# Patient Record
Sex: Female | Born: 1955
Health system: Southern US, Community
[De-identification: ages and names within clinical notes are randomized; demographics above are authoritative.]

## PROBLEM LIST (undated history)

## (undated) DIAGNOSIS — E785 Hyperlipidemia, unspecified: Secondary | ICD-10-CM

## (undated) DIAGNOSIS — E079 Disorder of thyroid, unspecified: Secondary | ICD-10-CM

## (undated) HISTORY — PX: NO PAST SURGERIES: SHX2092

---

## 2013-12-07 ENCOUNTER — Other Ambulatory Visit (HOSPITAL_COMMUNITY)
Admission: RE | Admit: 2013-12-07 | Discharge: 2013-12-07 | Disposition: A | Discharge status: Home / Self Care | Payer: BC Managed Care – PPO | Source: Ambulatory Visit | Attending: Obstetrics & Gynecology | Admitting: Obstetrics & Gynecology

## 2013-12-07 DIAGNOSIS — Z1151 Encounter for screening for human papillomavirus (HPV): Secondary | ICD-10-CM | POA: Insufficient documentation

## 2013-12-07 DIAGNOSIS — Z01419 Encounter for gynecological examination (general) (routine) without abnormal findings: Secondary | ICD-10-CM | POA: Insufficient documentation

## 2013-12-09 ENCOUNTER — Other Ambulatory Visit: Payer: Self-pay

## 2013-12-09 DIAGNOSIS — Z1231 Encounter for screening mammogram for malignant neoplasm of breast: Secondary | ICD-10-CM

## 2013-12-28 ENCOUNTER — Ambulatory Visit
Admission: RE | Admit: 2013-12-28 | Discharge: 2013-12-28 | Disposition: A | Discharge status: Home / Self Care | Payer: BC Managed Care – PPO | Source: Ambulatory Visit

## 2013-12-28 DIAGNOSIS — Z1231 Encounter for screening mammogram for malignant neoplasm of breast: Secondary | ICD-10-CM

## 2014-12-02 DIAGNOSIS — J0100 Acute maxillary sinusitis, unspecified: Secondary | ICD-10-CM | POA: Diagnosis not present

## 2015-03-01 DIAGNOSIS — E039 Hypothyroidism, unspecified: Secondary | ICD-10-CM | POA: Diagnosis not present

## 2015-03-01 DIAGNOSIS — G43909 Migraine, unspecified, not intractable, without status migrainosus: Secondary | ICD-10-CM | POA: Diagnosis not present

## 2015-03-09 DIAGNOSIS — R51 Headache: Secondary | ICD-10-CM | POA: Diagnosis not present

## 2015-03-29 DIAGNOSIS — E785 Hyperlipidemia, unspecified: Secondary | ICD-10-CM | POA: Diagnosis not present

## 2015-03-29 DIAGNOSIS — E039 Hypothyroidism, unspecified: Secondary | ICD-10-CM | POA: Diagnosis not present

## 2015-05-04 DIAGNOSIS — G43009 Migraine without aura, not intractable, without status migrainosus: Secondary | ICD-10-CM | POA: Diagnosis not present

## 2015-05-04 DIAGNOSIS — H532 Diplopia: Secondary | ICD-10-CM | POA: Diagnosis not present

## 2015-05-04 DIAGNOSIS — R51 Headache: Secondary | ICD-10-CM | POA: Diagnosis not present

## 2015-05-04 DIAGNOSIS — G44201 Tension-type headache, unspecified, intractable: Secondary | ICD-10-CM | POA: Diagnosis not present

## 2015-06-26 ENCOUNTER — Other Ambulatory Visit: Payer: Self-pay

## 2015-06-26 DIAGNOSIS — Z1231 Encounter for screening mammogram for malignant neoplasm of breast: Secondary | ICD-10-CM

## 2015-07-07 DIAGNOSIS — L299 Pruritus, unspecified: Secondary | ICD-10-CM | POA: Diagnosis not present

## 2015-07-07 DIAGNOSIS — L309 Dermatitis, unspecified: Secondary | ICD-10-CM | POA: Diagnosis not present

## 2015-07-07 DIAGNOSIS — R6884 Jaw pain: Secondary | ICD-10-CM | POA: Diagnosis not present

## 2015-07-18 DIAGNOSIS — K921 Melena: Secondary | ICD-10-CM | POA: Diagnosis not present

## 2015-07-18 DIAGNOSIS — Z86010 Personal history of colonic polyps: Secondary | ICD-10-CM | POA: Diagnosis not present

## 2015-07-24 DIAGNOSIS — Z01419 Encounter for gynecological examination (general) (routine) without abnormal findings: Secondary | ICD-10-CM | POA: Diagnosis not present

## 2015-07-27 DIAGNOSIS — Z8371 Family history of colonic polyps: Secondary | ICD-10-CM | POA: Diagnosis not present

## 2015-07-27 DIAGNOSIS — K922 Gastrointestinal hemorrhage, unspecified: Secondary | ICD-10-CM | POA: Diagnosis not present

## 2015-07-27 DIAGNOSIS — K5730 Diverticulosis of large intestine without perforation or abscess without bleeding: Secondary | ICD-10-CM | POA: Diagnosis not present

## 2015-07-27 DIAGNOSIS — K648 Other hemorrhoids: Secondary | ICD-10-CM | POA: Diagnosis not present

## 2015-07-27 DIAGNOSIS — Z86010 Personal history of colonic polyps: Secondary | ICD-10-CM | POA: Diagnosis not present

## 2015-08-01 ENCOUNTER — Ambulatory Visit
Admission: RE | Admit: 2015-08-01 | Discharge: 2015-08-01 | Disposition: A | Discharge status: Home / Self Care | Payer: Medicare Other | Source: Ambulatory Visit

## 2015-08-01 DIAGNOSIS — Z1231 Encounter for screening mammogram for malignant neoplasm of breast: Secondary | ICD-10-CM | POA: Diagnosis not present

## 2015-08-09 DIAGNOSIS — L219 Seborrheic dermatitis, unspecified: Secondary | ICD-10-CM | POA: Diagnosis not present

## 2015-08-09 DIAGNOSIS — L820 Inflamed seborrheic keratosis: Secondary | ICD-10-CM | POA: Diagnosis not present

## 2015-08-17 DIAGNOSIS — J029 Acute pharyngitis, unspecified: Secondary | ICD-10-CM | POA: Diagnosis not present

## 2015-08-29 DIAGNOSIS — R05 Cough: Secondary | ICD-10-CM | POA: Diagnosis not present

## 2015-09-13 DIAGNOSIS — L820 Inflamed seborrheic keratosis: Secondary | ICD-10-CM | POA: Diagnosis not present

## 2015-10-05 DIAGNOSIS — E785 Hyperlipidemia, unspecified: Secondary | ICD-10-CM | POA: Diagnosis not present

## 2015-12-28 DIAGNOSIS — R51 Headache: Secondary | ICD-10-CM | POA: Diagnosis not present

## 2015-12-28 DIAGNOSIS — H9190 Unspecified hearing loss, unspecified ear: Secondary | ICD-10-CM | POA: Diagnosis not present

## 2016-03-11 DIAGNOSIS — L84 Corns and callosities: Secondary | ICD-10-CM | POA: Diagnosis not present

## 2016-03-11 DIAGNOSIS — I781 Nevus, non-neoplastic: Secondary | ICD-10-CM | POA: Diagnosis not present

## 2016-04-08 DIAGNOSIS — G43909 Migraine, unspecified, not intractable, without status migrainosus: Secondary | ICD-10-CM | POA: Diagnosis not present

## 2016-04-08 DIAGNOSIS — H9190 Unspecified hearing loss, unspecified ear: Secondary | ICD-10-CM | POA: Diagnosis not present

## 2016-04-08 DIAGNOSIS — E039 Hypothyroidism, unspecified: Secondary | ICD-10-CM | POA: Diagnosis not present

## 2016-04-08 DIAGNOSIS — E7800 Pure hypercholesterolemia, unspecified: Secondary | ICD-10-CM | POA: Diagnosis not present

## 2016-07-04 ENCOUNTER — Other Ambulatory Visit: Payer: Self-pay | Admitting: Family Medicine

## 2016-07-04 DIAGNOSIS — Z1231 Encounter for screening mammogram for malignant neoplasm of breast: Secondary | ICD-10-CM

## 2016-07-15 DIAGNOSIS — E7800 Pure hypercholesterolemia, unspecified: Secondary | ICD-10-CM | POA: Diagnosis not present

## 2016-08-08 ENCOUNTER — Ambulatory Visit
Admission: RE | Admit: 2016-08-08 | Discharge: 2016-08-08 | Disposition: A | Discharge status: Home / Self Care | Payer: Medicare Other | Source: Ambulatory Visit | Attending: Family Medicine | Admitting: Family Medicine

## 2016-08-08 DIAGNOSIS — Z1231 Encounter for screening mammogram for malignant neoplasm of breast: Secondary | ICD-10-CM

## 2016-08-12 ENCOUNTER — Other Ambulatory Visit: Payer: Self-pay | Admitting: Family Medicine

## 2016-08-12 DIAGNOSIS — R928 Other abnormal and inconclusive findings on diagnostic imaging of breast: Secondary | ICD-10-CM

## 2016-08-20 ENCOUNTER — Ambulatory Visit
Admission: RE | Admit: 2016-08-20 | Discharge: 2016-08-20 | Disposition: A | Discharge status: Home / Self Care | Payer: Medicare Other | Source: Ambulatory Visit | Attending: Family Medicine | Admitting: Family Medicine

## 2016-08-20 DIAGNOSIS — R928 Other abnormal and inconclusive findings on diagnostic imaging of breast: Secondary | ICD-10-CM | POA: Diagnosis not present

## 2016-09-05 DIAGNOSIS — G43009 Migraine without aura, not intractable, without status migrainosus: Secondary | ICD-10-CM | POA: Diagnosis not present

## 2016-09-05 DIAGNOSIS — F439 Reaction to severe stress, unspecified: Secondary | ICD-10-CM | POA: Diagnosis not present

## 2016-09-24 DIAGNOSIS — E7800 Pure hypercholesterolemia, unspecified: Secondary | ICD-10-CM | POA: Diagnosis not present

## 2016-09-25 DIAGNOSIS — J029 Acute pharyngitis, unspecified: Secondary | ICD-10-CM | POA: Diagnosis not present

## 2016-09-30 DIAGNOSIS — N952 Postmenopausal atrophic vaginitis: Secondary | ICD-10-CM | POA: Diagnosis not present

## 2016-09-30 DIAGNOSIS — L298 Other pruritus: Secondary | ICD-10-CM | POA: Diagnosis not present

## 2016-09-30 DIAGNOSIS — R102 Pelvic and perineal pain: Secondary | ICD-10-CM | POA: Diagnosis not present

## 2016-10-07 DIAGNOSIS — R102 Pelvic and perineal pain: Secondary | ICD-10-CM | POA: Diagnosis not present

## 2016-10-21 DIAGNOSIS — R109 Unspecified abdominal pain: Secondary | ICD-10-CM | POA: Diagnosis not present

## 2016-10-22 DIAGNOSIS — Z86010 Personal history of colonic polyps: Secondary | ICD-10-CM | POA: Diagnosis not present

## 2016-10-22 DIAGNOSIS — K5900 Constipation, unspecified: Secondary | ICD-10-CM | POA: Diagnosis not present

## 2016-10-22 DIAGNOSIS — R109 Unspecified abdominal pain: Secondary | ICD-10-CM | POA: Diagnosis not present

## 2017-01-21 DIAGNOSIS — K921 Melena: Secondary | ICD-10-CM | POA: Diagnosis not present

## 2017-01-21 DIAGNOSIS — K5900 Constipation, unspecified: Secondary | ICD-10-CM | POA: Diagnosis not present

## 2017-03-17 DIAGNOSIS — J029 Acute pharyngitis, unspecified: Secondary | ICD-10-CM | POA: Diagnosis not present

## 2017-04-14 DIAGNOSIS — Z1159 Encounter for screening for other viral diseases: Secondary | ICD-10-CM | POA: Diagnosis not present

## 2017-04-14 DIAGNOSIS — E7800 Pure hypercholesterolemia, unspecified: Secondary | ICD-10-CM | POA: Diagnosis not present

## 2017-04-14 DIAGNOSIS — Z6824 Body mass index (BMI) 24.0-24.9, adult: Secondary | ICD-10-CM | POA: Diagnosis not present

## 2017-04-14 DIAGNOSIS — H9190 Unspecified hearing loss, unspecified ear: Secondary | ICD-10-CM | POA: Diagnosis not present

## 2017-04-14 DIAGNOSIS — B002 Herpesviral gingivostomatitis and pharyngotonsillitis: Secondary | ICD-10-CM | POA: Diagnosis not present

## 2017-04-14 DIAGNOSIS — E039 Hypothyroidism, unspecified: Secondary | ICD-10-CM | POA: Diagnosis not present

## 2017-04-14 DIAGNOSIS — Z0000 Encounter for general adult medical examination without abnormal findings: Secondary | ICD-10-CM | POA: Diagnosis not present

## 2017-04-14 DIAGNOSIS — G43909 Migraine, unspecified, not intractable, without status migrainosus: Secondary | ICD-10-CM | POA: Diagnosis not present

## 2017-04-22 DIAGNOSIS — B029 Zoster without complications: Secondary | ICD-10-CM | POA: Diagnosis not present

## 2017-05-07 DIAGNOSIS — E860 Dehydration: Secondary | ICD-10-CM | POA: Diagnosis not present

## 2017-05-19 DIAGNOSIS — B078 Other viral warts: Secondary | ICD-10-CM | POA: Diagnosis not present

## 2017-05-19 DIAGNOSIS — L821 Other seborrheic keratosis: Secondary | ICD-10-CM | POA: Diagnosis not present

## 2017-05-19 DIAGNOSIS — Z1283 Encounter for screening for malignant neoplasm of skin: Secondary | ICD-10-CM | POA: Diagnosis not present

## 2017-06-24 DIAGNOSIS — E860 Dehydration: Secondary | ICD-10-CM | POA: Diagnosis not present

## 2017-07-23 DIAGNOSIS — Z01419 Encounter for gynecological examination (general) (routine) without abnormal findings: Secondary | ICD-10-CM | POA: Diagnosis not present

## 2017-07-28 ENCOUNTER — Other Ambulatory Visit: Payer: Self-pay | Admitting: Family Medicine

## 2017-07-28 DIAGNOSIS — Z1231 Encounter for screening mammogram for malignant neoplasm of breast: Secondary | ICD-10-CM

## 2017-08-28 ENCOUNTER — Ambulatory Visit
Admission: RE | Admit: 2017-08-28 | Discharge: 2017-08-28 | Disposition: A | Discharge status: Home / Self Care | Payer: Medicare Other | Source: Ambulatory Visit | Attending: Family Medicine | Admitting: Family Medicine

## 2017-08-28 DIAGNOSIS — Z1231 Encounter for screening mammogram for malignant neoplasm of breast: Secondary | ICD-10-CM | POA: Diagnosis not present

## 2017-09-08 DIAGNOSIS — L84 Corns and callosities: Secondary | ICD-10-CM | POA: Diagnosis not present

## 2017-09-26 DIAGNOSIS — J0110 Acute frontal sinusitis, unspecified: Secondary | ICD-10-CM | POA: Diagnosis not present

## 2017-10-21 DIAGNOSIS — R05 Cough: Secondary | ICD-10-CM | POA: Diagnosis not present

## 2017-11-15 DIAGNOSIS — J309 Allergic rhinitis, unspecified: Secondary | ICD-10-CM | POA: Diagnosis not present

## 2017-12-25 DIAGNOSIS — B001 Herpesviral vesicular dermatitis: Secondary | ICD-10-CM | POA: Diagnosis not present

## 2018-01-29 DIAGNOSIS — S46912A Strain of unspecified muscle, fascia and tendon at shoulder and upper arm level, left arm, initial encounter: Secondary | ICD-10-CM | POA: Diagnosis not present

## 2018-02-19 DIAGNOSIS — S46912D Strain of unspecified muscle, fascia and tendon at shoulder and upper arm level, left arm, subsequent encounter: Secondary | ICD-10-CM | POA: Diagnosis not present

## 2018-03-09 DIAGNOSIS — M778 Other enthesopathies, not elsewhere classified: Secondary | ICD-10-CM | POA: Diagnosis not present

## 2018-03-10 DIAGNOSIS — M7712 Lateral epicondylitis, left elbow: Secondary | ICD-10-CM | POA: Diagnosis not present

## 2018-03-10 DIAGNOSIS — M25522 Pain in left elbow: Secondary | ICD-10-CM | POA: Diagnosis not present

## 2018-03-10 DIAGNOSIS — M7710 Lateral epicondylitis, unspecified elbow: Secondary | ICD-10-CM | POA: Insufficient documentation

## 2018-03-24 DIAGNOSIS — M7712 Lateral epicondylitis, left elbow: Secondary | ICD-10-CM | POA: Diagnosis not present

## 2018-04-22 DIAGNOSIS — E7800 Pure hypercholesterolemia, unspecified: Secondary | ICD-10-CM | POA: Diagnosis not present

## 2018-04-22 DIAGNOSIS — M7712 Lateral epicondylitis, left elbow: Secondary | ICD-10-CM | POA: Diagnosis not present

## 2018-04-22 DIAGNOSIS — H9190 Unspecified hearing loss, unspecified ear: Secondary | ICD-10-CM | POA: Diagnosis not present

## 2018-04-22 DIAGNOSIS — Z1389 Encounter for screening for other disorder: Secondary | ICD-10-CM | POA: Diagnosis not present

## 2018-04-22 DIAGNOSIS — Z0000 Encounter for general adult medical examination without abnormal findings: Secondary | ICD-10-CM | POA: Diagnosis not present

## 2018-04-22 DIAGNOSIS — E039 Hypothyroidism, unspecified: Secondary | ICD-10-CM | POA: Diagnosis not present

## 2018-04-22 DIAGNOSIS — B002 Herpesviral gingivostomatitis and pharyngotonsillitis: Secondary | ICD-10-CM | POA: Diagnosis not present

## 2018-04-22 DIAGNOSIS — Z136 Encounter for screening for cardiovascular disorders: Secondary | ICD-10-CM | POA: Diagnosis not present

## 2018-04-22 DIAGNOSIS — J309 Allergic rhinitis, unspecified: Secondary | ICD-10-CM | POA: Diagnosis not present

## 2018-04-22 DIAGNOSIS — G43909 Migraine, unspecified, not intractable, without status migrainosus: Secondary | ICD-10-CM | POA: Diagnosis not present

## 2018-05-19 DIAGNOSIS — H019 Unspecified inflammation of eyelid: Secondary | ICD-10-CM | POA: Diagnosis not present

## 2018-06-10 DIAGNOSIS — L84 Corns and callosities: Secondary | ICD-10-CM | POA: Diagnosis not present

## 2018-07-13 DIAGNOSIS — L820 Inflamed seborrheic keratosis: Secondary | ICD-10-CM | POA: Diagnosis not present

## 2018-08-06 ENCOUNTER — Other Ambulatory Visit: Payer: Self-pay | Admitting: Family Medicine

## 2018-08-06 DIAGNOSIS — Z1231 Encounter for screening mammogram for malignant neoplasm of breast: Secondary | ICD-10-CM

## 2018-08-13 DIAGNOSIS — M533 Sacrococcygeal disorders, not elsewhere classified: Secondary | ICD-10-CM | POA: Diagnosis not present

## 2018-08-13 DIAGNOSIS — W19XXXA Unspecified fall, initial encounter: Secondary | ICD-10-CM | POA: Diagnosis not present

## 2018-09-11 ENCOUNTER — Ambulatory Visit
Admission: RE | Admit: 2018-09-11 | Discharge: 2018-09-11 | Disposition: A | Discharge status: Home / Self Care | Payer: Medicare Other | Source: Ambulatory Visit | Attending: Family Medicine | Admitting: Family Medicine

## 2018-09-11 DIAGNOSIS — Z1231 Encounter for screening mammogram for malignant neoplasm of breast: Secondary | ICD-10-CM | POA: Diagnosis not present

## 2018-12-03 DIAGNOSIS — R51 Headache: Secondary | ICD-10-CM | POA: Diagnosis not present

## 2018-12-03 DIAGNOSIS — Z9109 Other allergy status, other than to drugs and biological substances: Secondary | ICD-10-CM | POA: Diagnosis not present

## 2019-05-18 DIAGNOSIS — H9190 Unspecified hearing loss, unspecified ear: Secondary | ICD-10-CM | POA: Diagnosis not present

## 2019-05-18 DIAGNOSIS — R350 Frequency of micturition: Secondary | ICD-10-CM | POA: Diagnosis not present

## 2019-05-18 DIAGNOSIS — B002 Herpesviral gingivostomatitis and pharyngotonsillitis: Secondary | ICD-10-CM | POA: Diagnosis not present

## 2019-05-18 DIAGNOSIS — J309 Allergic rhinitis, unspecified: Secondary | ICD-10-CM | POA: Diagnosis not present

## 2019-05-18 DIAGNOSIS — Z1389 Encounter for screening for other disorder: Secondary | ICD-10-CM | POA: Diagnosis not present

## 2019-05-18 DIAGNOSIS — E7800 Pure hypercholesterolemia, unspecified: Secondary | ICD-10-CM | POA: Diagnosis not present

## 2019-05-18 DIAGNOSIS — N3281 Overactive bladder: Secondary | ICD-10-CM | POA: Diagnosis not present

## 2019-05-18 DIAGNOSIS — G43909 Migraine, unspecified, not intractable, without status migrainosus: Secondary | ICD-10-CM | POA: Diagnosis not present

## 2019-05-18 DIAGNOSIS — Z0000 Encounter for general adult medical examination without abnormal findings: Secondary | ICD-10-CM | POA: Diagnosis not present

## 2019-05-18 DIAGNOSIS — E039 Hypothyroidism, unspecified: Secondary | ICD-10-CM | POA: Diagnosis not present

## 2019-05-18 DIAGNOSIS — M7712 Lateral epicondylitis, left elbow: Secondary | ICD-10-CM | POA: Diagnosis not present

## 2019-05-18 DIAGNOSIS — H60543 Acute eczematoid otitis externa, bilateral: Secondary | ICD-10-CM | POA: Diagnosis not present

## 2019-06-23 DIAGNOSIS — Z86010 Personal history of colonic polyps: Secondary | ICD-10-CM | POA: Diagnosis not present

## 2019-06-23 DIAGNOSIS — K649 Unspecified hemorrhoids: Secondary | ICD-10-CM | POA: Diagnosis not present

## 2019-07-27 DIAGNOSIS — Z01419 Encounter for gynecological examination (general) (routine) without abnormal findings: Secondary | ICD-10-CM | POA: Diagnosis not present

## 2019-08-10 ENCOUNTER — Other Ambulatory Visit: Payer: Self-pay | Admitting: Family Medicine

## 2019-08-10 DIAGNOSIS — Z1231 Encounter for screening mammogram for malignant neoplasm of breast: Secondary | ICD-10-CM

## 2019-09-14 DIAGNOSIS — N3001 Acute cystitis with hematuria: Secondary | ICD-10-CM | POA: Diagnosis not present

## 2019-09-14 DIAGNOSIS — R300 Dysuria: Secondary | ICD-10-CM | POA: Diagnosis not present

## 2019-09-24 DIAGNOSIS — R3989 Other symptoms and signs involving the genitourinary system: Secondary | ICD-10-CM | POA: Diagnosis not present

## 2019-09-27 ENCOUNTER — Other Ambulatory Visit: Payer: Self-pay

## 2019-09-27 ENCOUNTER — Ambulatory Visit
Admission: RE | Admit: 2019-09-27 | Discharge: 2019-09-27 | Disposition: A | Discharge status: Home / Self Care | Payer: Medicare Other | Source: Ambulatory Visit | Attending: Family Medicine | Admitting: Family Medicine

## 2019-09-27 DIAGNOSIS — Z1231 Encounter for screening mammogram for malignant neoplasm of breast: Secondary | ICD-10-CM | POA: Diagnosis not present

## 2019-10-08 DIAGNOSIS — R300 Dysuria: Secondary | ICD-10-CM | POA: Diagnosis not present

## 2019-10-08 DIAGNOSIS — N3281 Overactive bladder: Secondary | ICD-10-CM | POA: Diagnosis not present

## 2019-10-27 DIAGNOSIS — Z1283 Encounter for screening for malignant neoplasm of skin: Secondary | ICD-10-CM | POA: Diagnosis not present

## 2019-10-27 DIAGNOSIS — D225 Melanocytic nevi of trunk: Secondary | ICD-10-CM | POA: Diagnosis not present

## 2019-10-27 DIAGNOSIS — L821 Other seborrheic keratosis: Secondary | ICD-10-CM | POA: Diagnosis not present

## 2019-11-23 DIAGNOSIS — N3281 Overactive bladder: Secondary | ICD-10-CM | POA: Diagnosis not present

## 2019-12-20 DIAGNOSIS — N3281 Overactive bladder: Secondary | ICD-10-CM | POA: Diagnosis not present

## 2020-01-19 DIAGNOSIS — N3281 Overactive bladder: Secondary | ICD-10-CM | POA: Diagnosis not present

## 2020-02-15 DIAGNOSIS — L820 Inflamed seborrheic keratosis: Secondary | ICD-10-CM | POA: Diagnosis not present

## 2020-02-15 DIAGNOSIS — L728 Other follicular cysts of the skin and subcutaneous tissue: Secondary | ICD-10-CM | POA: Diagnosis not present

## 2020-02-29 DIAGNOSIS — N3001 Acute cystitis with hematuria: Secondary | ICD-10-CM | POA: Diagnosis not present

## 2020-02-29 DIAGNOSIS — R300 Dysuria: Secondary | ICD-10-CM | POA: Diagnosis not present

## 2020-04-05 DIAGNOSIS — R399 Unspecified symptoms and signs involving the genitourinary system: Secondary | ICD-10-CM | POA: Diagnosis not present

## 2020-04-05 DIAGNOSIS — N3000 Acute cystitis without hematuria: Secondary | ICD-10-CM | POA: Diagnosis not present

## 2020-04-26 DIAGNOSIS — K649 Unspecified hemorrhoids: Secondary | ICD-10-CM | POA: Diagnosis not present

## 2020-04-26 DIAGNOSIS — Z86010 Personal history of colonic polyps: Secondary | ICD-10-CM | POA: Diagnosis not present

## 2020-05-24 DIAGNOSIS — E039 Hypothyroidism, unspecified: Secondary | ICD-10-CM | POA: Diagnosis not present

## 2020-05-24 DIAGNOSIS — Z0000 Encounter for general adult medical examination without abnormal findings: Secondary | ICD-10-CM | POA: Diagnosis not present

## 2020-05-24 DIAGNOSIS — N3281 Overactive bladder: Secondary | ICD-10-CM | POA: Diagnosis not present

## 2020-05-24 DIAGNOSIS — B002 Herpesviral gingivostomatitis and pharyngotonsillitis: Secondary | ICD-10-CM | POA: Diagnosis not present

## 2020-05-24 DIAGNOSIS — M7712 Lateral epicondylitis, left elbow: Secondary | ICD-10-CM | POA: Diagnosis not present

## 2020-05-24 DIAGNOSIS — H9190 Unspecified hearing loss, unspecified ear: Secondary | ICD-10-CM | POA: Diagnosis not present

## 2020-05-24 DIAGNOSIS — E7800 Pure hypercholesterolemia, unspecified: Secondary | ICD-10-CM | POA: Diagnosis not present

## 2020-05-24 DIAGNOSIS — N3020 Other chronic cystitis without hematuria: Secondary | ICD-10-CM | POA: Diagnosis not present

## 2020-05-24 DIAGNOSIS — G43909 Migraine, unspecified, not intractable, without status migrainosus: Secondary | ICD-10-CM | POA: Diagnosis not present

## 2020-05-24 DIAGNOSIS — H60543 Acute eczematoid otitis externa, bilateral: Secondary | ICD-10-CM | POA: Diagnosis not present

## 2020-05-24 DIAGNOSIS — J309 Allergic rhinitis, unspecified: Secondary | ICD-10-CM | POA: Diagnosis not present

## 2020-06-28 DIAGNOSIS — E860 Dehydration: Secondary | ICD-10-CM | POA: Diagnosis not present

## 2020-07-29 DIAGNOSIS — J029 Acute pharyngitis, unspecified: Secondary | ICD-10-CM | POA: Diagnosis not present

## 2020-07-29 DIAGNOSIS — J329 Chronic sinusitis, unspecified: Secondary | ICD-10-CM | POA: Diagnosis not present

## 2020-08-24 DIAGNOSIS — H905 Unspecified sensorineural hearing loss: Secondary | ICD-10-CM | POA: Insufficient documentation

## 2020-08-24 DIAGNOSIS — L299 Pruritus, unspecified: Secondary | ICD-10-CM | POA: Diagnosis not present

## 2020-09-06 DIAGNOSIS — R519 Headache, unspecified: Secondary | ICD-10-CM | POA: Diagnosis not present

## 2020-09-06 DIAGNOSIS — R0981 Nasal congestion: Secondary | ICD-10-CM | POA: Diagnosis not present

## 2020-10-06 DIAGNOSIS — M25522 Pain in left elbow: Secondary | ICD-10-CM | POA: Diagnosis not present

## 2020-10-06 DIAGNOSIS — M25562 Pain in left knee: Secondary | ICD-10-CM | POA: Diagnosis not present

## 2020-10-06 DIAGNOSIS — B001 Herpesviral vesicular dermatitis: Secondary | ICD-10-CM | POA: Diagnosis not present

## 2020-10-13 ENCOUNTER — Other Ambulatory Visit: Payer: Self-pay | Admitting: Family Medicine

## 2020-10-13 ENCOUNTER — Other Ambulatory Visit: Payer: Self-pay

## 2020-10-13 ENCOUNTER — Ambulatory Visit
Admission: RE | Admit: 2020-10-13 | Discharge: 2020-10-13 | Disposition: A | Discharge status: Home / Self Care | Payer: Medicare Other | Source: Ambulatory Visit | Attending: Family Medicine | Admitting: Family Medicine

## 2020-10-13 DIAGNOSIS — M25562 Pain in left knee: Secondary | ICD-10-CM

## 2020-10-13 DIAGNOSIS — Z111 Encounter for screening for respiratory tuberculosis: Secondary | ICD-10-CM

## 2020-11-14 ENCOUNTER — Other Ambulatory Visit: Payer: Self-pay | Admitting: Family Medicine

## 2020-11-14 DIAGNOSIS — Z0000 Encounter for general adult medical examination without abnormal findings: Secondary | ICD-10-CM

## 2020-12-14 DIAGNOSIS — R109 Unspecified abdominal pain: Secondary | ICD-10-CM | POA: Diagnosis not present

## 2021-01-02 DIAGNOSIS — L308 Other specified dermatitis: Secondary | ICD-10-CM | POA: Diagnosis not present

## 2021-01-02 DIAGNOSIS — L820 Inflamed seborrheic keratosis: Secondary | ICD-10-CM | POA: Diagnosis not present

## 2021-01-05 ENCOUNTER — Ambulatory Visit: Payer: Medicare Other

## 2021-01-19 ENCOUNTER — Other Ambulatory Visit: Payer: Self-pay

## 2021-01-19 ENCOUNTER — Ambulatory Visit
Admission: RE | Admit: 2021-01-19 | Discharge: 2021-01-19 | Disposition: A | Discharge status: Home / Self Care | Payer: 59 | Source: Ambulatory Visit | Attending: Family Medicine | Admitting: Family Medicine

## 2021-01-19 DIAGNOSIS — Z0000 Encounter for general adult medical examination without abnormal findings: Secondary | ICD-10-CM

## 2021-03-26 DIAGNOSIS — Z86010 Personal history of colonic polyps: Secondary | ICD-10-CM | POA: Diagnosis not present

## 2021-03-26 DIAGNOSIS — D122 Benign neoplasm of ascending colon: Secondary | ICD-10-CM | POA: Diagnosis not present

## 2021-03-26 DIAGNOSIS — K5730 Diverticulosis of large intestine without perforation or abscess without bleeding: Secondary | ICD-10-CM | POA: Diagnosis not present

## 2021-03-26 DIAGNOSIS — D123 Benign neoplasm of transverse colon: Secondary | ICD-10-CM | POA: Diagnosis not present

## 2021-03-26 DIAGNOSIS — K648 Other hemorrhoids: Secondary | ICD-10-CM | POA: Diagnosis not present

## 2021-04-04 DIAGNOSIS — L299 Pruritus, unspecified: Secondary | ICD-10-CM | POA: Diagnosis not present

## 2021-04-04 DIAGNOSIS — H905 Unspecified sensorineural hearing loss: Secondary | ICD-10-CM | POA: Diagnosis not present

## 2021-04-20 ENCOUNTER — Other Ambulatory Visit: Payer: Self-pay

## 2021-04-20 ENCOUNTER — Encounter (HOSPITAL_COMMUNITY): Payer: Self-pay | Admitting: Emergency Medicine

## 2021-04-20 ENCOUNTER — Emergency Department (HOSPITAL_COMMUNITY)
Admission: EM | Admit: 2021-04-20 | Discharge: 2021-04-21 | Disposition: A | Discharge status: Home / Self Care | Payer: 59 | Attending: Emergency Medicine | Admitting: Emergency Medicine

## 2021-04-20 DIAGNOSIS — Z5321 Procedure and treatment not carried out due to patient leaving prior to being seen by health care provider: Secondary | ICD-10-CM | POA: Insufficient documentation

## 2021-04-20 DIAGNOSIS — R519 Headache, unspecified: Secondary | ICD-10-CM | POA: Insufficient documentation

## 2021-04-20 NOTE — ED Triage Notes (Signed)
Pt is hearing impaired, used a sign language interpretor.  Pt reports around 6am she got up to use the bathroom had a headache and then "passed out", woke up on the floor and does not remember the situation.  No blood thinners.  She called her provider around 2pm and they told her to come in.

## 2021-04-21 LAB — BASIC METABOLIC PANEL
Anion gap: 7 (ref 5–15)
BUN: 13 mg/dL (ref 8–23)
CO2: 26 mmol/L (ref 22–32)
Calcium: 9.6 mg/dL (ref 8.9–10.3)
Chloride: 107 mmol/L (ref 98–111)
Creatinine, Ser: 0.97 mg/dL (ref 0.44–1.00)
GFR, Estimated: >60 mL/min (ref >60)
Glucose, Bld: 108 mg/dL — ABNORMAL HIGH (ref 70–99)
Potassium: 4.3 mmol/L (ref 3.5–5.1)
Sodium: 140 mmol/L (ref 135–145)

## 2021-04-21 LAB — URINALYSIS, ROUTINE W REFLEX MICROSCOPIC
Bilirubin Urine: NEGATIVE
Glucose, UA: NEGATIVE mg/dL
Hgb urine dipstick: NEGATIVE
Ketones, ur: NEGATIVE mg/dL
Leukocytes,Ua: NEGATIVE
Nitrite: NEGATIVE
Protein, ur: NEGATIVE mg/dL
Specific Gravity, Urine: 1.006 (ref 1.005–1.030)
pH: 7 (ref 5.0–8.0)

## 2021-04-21 LAB — CBC
HCT: 41 % (ref 36.0–46.0)
Hemoglobin: 13.5 g/dL (ref 12.0–15.0)
MCH: 32.1 pg (ref 26.0–34.0)
MCHC: 32.9 g/dL (ref 30.0–36.0)
MCV: 97.4 fL (ref 80.0–100.0)
Platelets: 196 10*3/uL (ref 150–400)
RBC: 4.21 MIL/uL (ref 3.87–5.11)
RDW: 15.1 % (ref 11.5–15.5)
WBC: 4.4 10*3/uL (ref 4.0–10.5)
nRBC: 0 % (ref 0.0–0.2)

## 2021-04-21 NOTE — ED Notes (Signed)
Pt called for vital recheck. No response.

## 2021-04-22 ENCOUNTER — Emergency Department (HOSPITAL_BASED_OUTPATIENT_CLINIC_OR_DEPARTMENT_OTHER): Payer: 59

## 2021-04-22 ENCOUNTER — Other Ambulatory Visit: Payer: Self-pay

## 2021-04-22 ENCOUNTER — Encounter (HOSPITAL_BASED_OUTPATIENT_CLINIC_OR_DEPARTMENT_OTHER): Payer: Self-pay | Admitting: Emergency Medicine

## 2021-04-22 ENCOUNTER — Emergency Department (HOSPITAL_COMMUNITY): Admit: 2021-04-22 | Payer: 59

## 2021-04-22 ENCOUNTER — Emergency Department (HOSPITAL_BASED_OUTPATIENT_CLINIC_OR_DEPARTMENT_OTHER)
Admission: EM | Admit: 2021-04-22 | Discharge: 2021-04-22 | Disposition: A | Discharge status: Home / Self Care | Payer: 59 | Attending: Emergency Medicine | Admitting: Emergency Medicine

## 2021-04-22 DIAGNOSIS — S0003XA Contusion of scalp, initial encounter: Secondary | ICD-10-CM | POA: Diagnosis not present

## 2021-04-22 DIAGNOSIS — W01198A Fall on same level from slipping, tripping and stumbling with subsequent striking against other object, initial encounter: Secondary | ICD-10-CM | POA: Diagnosis not present

## 2021-04-22 DIAGNOSIS — R55 Syncope and collapse: Secondary | ICD-10-CM | POA: Diagnosis not present

## 2021-04-22 DIAGNOSIS — R519 Headache, unspecified: Secondary | ICD-10-CM | POA: Diagnosis not present

## 2021-04-22 DIAGNOSIS — S0990XA Unspecified injury of head, initial encounter: Secondary | ICD-10-CM | POA: Diagnosis present

## 2021-04-22 DIAGNOSIS — W19XXXA Unspecified fall, initial encounter: Secondary | ICD-10-CM

## 2021-04-22 DIAGNOSIS — Z043 Encounter for examination and observation following other accident: Secondary | ICD-10-CM | POA: Diagnosis not present

## 2021-04-22 HISTORY — DX: Hyperlipidemia, unspecified: E78.5

## 2021-04-22 HISTORY — DX: Disorder of thyroid, unspecified: E07.9

## 2021-04-22 LAB — COMPREHENSIVE METABOLIC PANEL
ALT: 16 U/L (ref 0–44)
AST: 22 U/L (ref 15–41)
Albumin: 4.2 g/dL (ref 3.5–5.0)
Alkaline Phosphatase: 70 U/L (ref 38–126)
Anion gap: 9 (ref 5–15)
BUN: 17 mg/dL (ref 8–23)
CO2: 28 mmol/L (ref 22–32)
Calcium: 9.2 mg/dL (ref 8.9–10.3)
Chloride: 102 mmol/L (ref 98–111)
Creatinine, Ser: 0.87 mg/dL (ref 0.44–1.00)
GFR, Estimated: >60 mL/min (ref >60)
Glucose, Bld: 117 mg/dL — ABNORMAL HIGH (ref 70–99)
Potassium: 4.5 mmol/L (ref 3.5–5.1)
Sodium: 139 mmol/L (ref 135–145)
Total Bilirubin: 0.4 mg/dL (ref 0.3–1.2)
Total Protein: 7.2 g/dL (ref 6.5–8.1)

## 2021-04-22 LAB — MAGNESIUM: Magnesium: 2.3 mg/dL (ref 1.7–2.4)

## 2021-04-22 LAB — CBC WITH DIFFERENTIAL/PLATELET
Abs Immature Granulocytes: 0.01 10*3/uL (ref 0.00–0.07)
Basophils Absolute: 0 10*3/uL (ref 0.0–0.1)
Basophils Relative: 1 %
Eosinophils Absolute: 0.1 10*3/uL (ref 0.0–0.5)
Eosinophils Relative: 2 %
HCT: 40.3 % (ref 36.0–46.0)
Hemoglobin: 12.9 g/dL (ref 12.0–15.0)
Immature Granulocytes: 0 %
Lymphocytes Relative: 31 %
Lymphs Abs: 1.7 10*3/uL (ref 0.7–4.0)
MCH: 31.7 pg (ref 26.0–34.0)
MCHC: 32 g/dL (ref 30.0–36.0)
MCV: 99 fL (ref 80.0–100.0)
Monocytes Absolute: 0.3 10*3/uL (ref 0.1–1.0)
Monocytes Relative: 6 %
Neutro Abs: 3.3 10*3/uL (ref 1.7–7.7)
Neutrophils Relative %: 60 %
Platelets: 185 10*3/uL (ref 150–400)
RBC: 4.07 MIL/uL (ref 3.87–5.11)
RDW: 15 % (ref 11.5–15.5)
WBC: 5.5 10*3/uL (ref 4.0–10.5)
nRBC: 0 % (ref 0.0–0.2)

## 2021-04-22 LAB — TROPONIN I (HIGH SENSITIVITY): Troponin I (High Sensitivity): 3 ng/L (ref <18)

## 2021-04-22 NOTE — ED Triage Notes (Signed)
Pt arrives pov, is hearing impaired, and arrives pov with c/o fall with R side and frontal  HA and loc on Friday. Was sent to ED by PCP. Pt denies dizziness, denies blurred vision

## 2021-04-22 NOTE — Discharge Instructions (Addendum)

## 2021-04-22 NOTE — ED Provider Notes (Signed)
Emergency Department Provider Note   I have reviewed the triage vital signs and the nursing notes.   HISTORY  Chief Complaint Fall  Video ASL interpreter used for encounter.   HPI Adriana Williams is a 65 y.o. female past medical history reviewed below presents to the ED with syncope, fall, and head injury.  Patient had an episode of syncope with fall and head injury 2 days ago.  She struck the back of her head and has pain in that location with a palpable bump since that time.  She denies any presyncope symptoms.  No chest pain, palpitations, sudden/severe headache prior to passing out.  She is not having pain in her neck.  She touch base with her PCP and was referred to the emergency department for further evaluation.  She is not on blood thinners.  She states she is been feeling overall well other than continued pain in the area where she struck her head. No confusion, vomiting, abdominal pain, or other symptoms.    Past Medical History:  Diagnosis Date   Hyperlipidemia    Thyroid disease     There are no problems to display for this patient.   History reviewed. No pertinent surgical history.  Allergies Patient has no known allergies.  History reviewed. No pertinent family history.  Social History Social History   Tobacco Use   Smoking status: Never  Substance Use Topics   Alcohol use: Not Currently    Review of Systems  Constitutional: No fever/chills Eyes: No visual changes. ENT: No sore throat. Cardiovascular: Denies chest pain. Positive syncope 2 days prior.  Respiratory: Denies shortness of breath. Gastrointestinal: No abdominal pain.  No nausea, no vomiting.  No diarrhea.  No constipation. Genitourinary: Negative for dysuria. Musculoskeletal: Negative for back pain. Skin: Negative for rash. Neurological: Negative for focal weakness or numbness. Positive HA after fall.   10-point ROS otherwise  negative.  ____________________________________________   PHYSICAL EXAM:  VITAL SIGNS: ED Triage Vitals  Enc Vitals Group     BP 04/22/21 1150 115/75     Pulse Rate 04/22/21 1150 70     Resp 04/22/21 1150 20     Temp 04/22/21 1144 98.8 F (37.1 C)     Temp Source 04/22/21 1144 Oral     SpO2 04/22/21 1150 96 %   Constitutional: Alert and oriented. Well appearing and in no acute distress. Eyes: Conjunctivae are normal. No photophobia.  Head: 3 cm hematoma to the right occipital scalp. No laceration.  Nose: No congestion/rhinnorhea. Mouth/Throat: Mucous membranes are moist.  Neck: No stridor.  No cervical spine tenderness to palpation. Cardiovascular: Normal rate, regular rhythm. Good peripheral circulation. Grossly normal heart sounds.   Respiratory: Normal respiratory effort.  No retractions. Lungs CTAB. Gastrointestinal: Soft and nontender. No distention.  Musculoskeletal: No lower extremity tenderness nor edema. No gross deformities of extremities. Neurologic: No gross focal neurologic deficits are appreciated. 5/5 strength in the bilateral upper/lower extremities.  Skin:  Skin is warm, dry and intact. No rash noted.   ____________________________________________   LABS (all labs ordered are listed, but only abnormal results are displayed)  Labs Reviewed  COMPREHENSIVE METABOLIC PANEL - Abnormal; Notable for the following components:      Result Value   Glucose, Bld 117 (*)    All other components within normal limits  CBC WITH DIFFERENTIAL/PLATELET  MAGNESIUM  TROPONIN I (HIGH SENSITIVITY)  TROPONIN I (HIGH SENSITIVITY)   ____________________________________________  EKG   EKG Interpretation  Date/Time:  Sunday April 22 2021 11:47:18 EDT Ventricular Rate:  75 PR Interval:  150 QRS Duration: 85 QT Interval:  519 QTC Calculation: 580 R Axis:   -45 Text Interpretation: Sinus rhythm Left axis deviation Low voltage, precordial leads Borderline T wave  abnormalities Similar to 04/20/21 tracing Confirmed by Nanda Quinton [phone]) on 04/22/2021 11:51:18 AM        ____________________________________________  RADIOLOGY  DG Chest 2 View  Result Date: 04/22/2021 CLINICAL DATA:  Fall EXAM: CHEST - 2 VIEW COMPARISON:  None. FINDINGS: The cardiomediastinal silhouette is normal in contour. No pleural effusion. No pneumothorax. No acute pleuroparenchymal abnormality. Visualized abdomen is unremarkable. No acute osseous abnormality noted. IMPRESSION: No acute cardiopulmonary abnormality. Electronically Signed   By: Valentino Saxon M.D.   On: 04/22/2021 13:11   CT HEAD WO CONTRAST (5MM)  Result Date: 04/22/2021 CLINICAL DATA:  Moderate to severe head trauma. Fall. Frontal headache and loss of consciousness. EXAM: CT HEAD WITHOUT CONTRAST TECHNIQUE: Contiguous axial images were obtained from the base of the skull through the vertex without intravenous contrast. COMPARISON:  None. FINDINGS: Brain: No evidence of acute infarction, hemorrhage, hydrocephalus, extra-axial collection or mass lesion/mass effect. Vascular: No hyperdense vessel or unexpected calcification. Skull: Normal. Negative for fracture or focal lesion. Sinuses/Orbits: No acute finding. Other: None. IMPRESSION: No acute intracranial abnormalities. Electronically Signed   By: Dorise Bullion III M.D.   On: 04/22/2021 13:14    ____________________________________________   PROCEDURES  Procedure(s) performed:   Procedures  None  ____________________________________________   INITIAL IMPRESSION / ASSESSMENT AND PLAN / ED COURSE  Pertinent labs & imaging results that were available during my care of the patient were reviewed by me and considered in my medical decision making (see chart for details).   Patient presents the emergency department for evaluation after syncope event 2 days ago.  Her EKG appears similar to tracing obtained 2 days ago.  Patient ultimately left without being  seen at that time.  Her QTC is calculated by the computer at 580 but this seems artificially Elijah Phommachanh in comparison to her prior and measuring here today. Plan for labs with syncope seeming to lead to fall and head injury.  Patient has focal tenderness over the posterior right scalp.  This presentation does not seem consistent with sentinel bleed from subarachnoid hemorrhage although this was considered.   Labs and imaging reviewed. Plan for PCP and Cardiology follow up for syncope. No fracture or hemorrhage on CT head. Patient feeling well. Place referral for syncope evaluation as an outpatient. Discussed results and f/u plan with patient using ASL interpreter.  ____________________________________________  FINAL CLINICAL IMPRESSION(S) / ED DIAGNOSES  Final diagnoses:  Injury of head, initial encounter  Syncope, unspecified syncope type     Note:  This document was prepared using Dragon voice recognition software and may include unintentional dictation errors.  Nanda Quinton, MD, [facility]    Laquita Harlan, [facility], MD 04/22/21 1420

## 2021-04-26 DIAGNOSIS — I959 Hypotension, unspecified: Secondary | ICD-10-CM | POA: Diagnosis not present

## 2021-04-26 DIAGNOSIS — R55 Syncope and collapse: Secondary | ICD-10-CM | POA: Diagnosis not present

## 2021-05-30 DIAGNOSIS — E7800 Pure hypercholesterolemia, unspecified: Secondary | ICD-10-CM | POA: Diagnosis not present

## 2021-05-30 DIAGNOSIS — Z0000 Encounter for general adult medical examination without abnormal findings: Secondary | ICD-10-CM | POA: Diagnosis not present

## 2021-05-30 DIAGNOSIS — E039 Hypothyroidism, unspecified: Secondary | ICD-10-CM | POA: Diagnosis not present

## 2021-06-27 ENCOUNTER — Telehealth: Payer: Self-pay | Admitting: Cardiology

## 2021-06-27 ENCOUNTER — Ambulatory Visit (INDEPENDENT_AMBULATORY_CARE_PROVIDER_SITE_OTHER): Payer: 59 | Admitting: Cardiology

## 2021-06-27 ENCOUNTER — Encounter: Payer: Self-pay | Admitting: Cardiology

## 2021-06-27 ENCOUNTER — Other Ambulatory Visit: Payer: Self-pay

## 2021-06-27 ENCOUNTER — Ambulatory Visit (INDEPENDENT_AMBULATORY_CARE_PROVIDER_SITE_OTHER): Payer: 59

## 2021-06-27 VITALS — BP 120/72 | HR 93 | Ht 65.0 in | Wt 162.0 lb

## 2021-06-27 DIAGNOSIS — R109 Unspecified abdominal pain: Secondary | ICD-10-CM | POA: Insufficient documentation

## 2021-06-27 DIAGNOSIS — R55 Syncope and collapse: Secondary | ICD-10-CM

## 2021-06-27 DIAGNOSIS — J309 Allergic rhinitis, unspecified: Secondary | ICD-10-CM | POA: Insufficient documentation

## 2021-06-27 DIAGNOSIS — F4320 Adjustment disorder, unspecified: Secondary | ICD-10-CM | POA: Insufficient documentation

## 2021-06-27 DIAGNOSIS — G43909 Migraine, unspecified, not intractable, without status migrainosus: Secondary | ICD-10-CM | POA: Insufficient documentation

## 2021-06-27 DIAGNOSIS — Z9109 Other allergy status, other than to drugs and biological substances: Secondary | ICD-10-CM | POA: Insufficient documentation

## 2021-06-27 DIAGNOSIS — N952 Postmenopausal atrophic vaginitis: Secondary | ICD-10-CM | POA: Insufficient documentation

## 2021-06-27 DIAGNOSIS — E7800 Pure hypercholesterolemia, unspecified: Secondary | ICD-10-CM

## 2021-06-27 DIAGNOSIS — E039 Hypothyroidism, unspecified: Secondary | ICD-10-CM | POA: Diagnosis not present

## 2021-06-27 DIAGNOSIS — N3020 Other chronic cystitis without hematuria: Secondary | ICD-10-CM | POA: Insufficient documentation

## 2021-06-27 DIAGNOSIS — K649 Unspecified hemorrhoids: Secondary | ICD-10-CM | POA: Insufficient documentation

## 2021-06-27 DIAGNOSIS — N3281 Overactive bladder: Secondary | ICD-10-CM | POA: Insufficient documentation

## 2021-06-27 DIAGNOSIS — K5730 Diverticulosis of large intestine without perforation or abscess without bleeding: Secondary | ICD-10-CM | POA: Insufficient documentation

## 2021-06-27 DIAGNOSIS — K921 Melena: Secondary | ICD-10-CM | POA: Insufficient documentation

## 2021-06-27 DIAGNOSIS — K5900 Constipation, unspecified: Secondary | ICD-10-CM | POA: Insufficient documentation

## 2021-06-27 DIAGNOSIS — Z860100 Personal history of colon polyps, unspecified: Secondary | ICD-10-CM | POA: Insufficient documentation

## 2021-06-27 DIAGNOSIS — B9689 Other specified bacterial agents as the cause of diseases classified elsewhere: Secondary | ICD-10-CM | POA: Insufficient documentation

## 2021-06-27 DIAGNOSIS — G43009 Migraine without aura, not intractable, without status migrainosus: Secondary | ICD-10-CM | POA: Insufficient documentation

## 2021-06-27 NOTE — Telephone Encounter (Signed)
Patient wanted to report some medications  cyclobenzaprine 10 mg as needed for migraines.  Estradiol 0.01% every 4 days.  Claritin 10 mg as needed for allergies Flonase (Nasal Spray ) as needed for allergies  Extra Strength Tylenol 500 mg as needed for headaches   She did not get a chance to tell Dr. Agustin Cree about these medications when she came to her visit this morning

## 2021-06-27 NOTE — Patient Instructions (Signed)
Medication Instructions:  Your physician recommends that you continue on your current medications as directed. Please refer to the Current Medication list given to you today.  *If you need a refill on your cardiac medications before your next appointment, please call your pharmacy*   Lab Work: None If you have labs (blood work) drawn today and your tests are completely normal, you will receive your results only by: [place] (if you have MyChart) OR A paper copy in the mail If you have any lab test that is abnormal or we need to change your treatment, we will call you to review the results.   Testing/Procedures: A zio monitor was ordered today. It will remain on for 14 days. You will then return monitor and event diary in provided box. It takes 1-2 weeks for report to be downloaded and returned to [place]. We will call you with the results. If monitor falls off or has orange flashing light, please call Zio for further instructions.   Your physician has requested that you have an echocardiogram. Echocardiography is a painless test that uses sound waves to create images of your heart. It provides your doctor with information about the size and shape of your heart and how well your heart's chambers and valves are working. This procedure takes approximately one hour. There are no restrictions for this procedure.    Follow-Up: At [facility], you and your health needs are our priority.  As part of our continuing mission to provide you with exceptional heart care, we have created designated Provider Care Teams.  These Care Teams include your primary Cardiologist (physician) and Advanced Practice Providers (APPs -  Physician Assistants and Nurse Practitioners) who all work together to provide you with the care you need, when you need it.  We recommend signing up for the patient portal called "MyChart".  Sign up information is provided on this After Visit Summary.  MyChart is used to connect with  patients for Virtual Visits (Telemedicine).  Patients are able to view lab/test results, encounter notes, upcoming appointments, etc.  Non-urgent messages can be sent to your provider as well.   To learn more about what you can do with MyChart, go to [email or web address].    Your next appointment:   2 month(s)  The format for your next appointment:   In Person  Provider:   Jenne Campus, MD   Other Instructions  Echocardiogram An echocardiogram is a test that uses sound waves (ultrasound) to produce images of the heart. Images from an echocardiogram can provide important information about: Heart size and shape. The size and thickness and movement of your heart's walls. Heart muscle function and strength. Heart valve function or if you have stenosis. Stenosis is when the heart valves are too narrow. If blood is flowing backward through the heart valves (regurgitation). A tumor or infectious growth around the heart valves. Areas of heart muscle that are not working well because of poor blood flow or injury from a heart attack. Aneurysm detection. An aneurysm is a weak or damaged part of an artery wall. The wall bulges out from the normal force of blood pumping through the body. Tell a health care provider about: Any allergies you have. All medicines you are taking, including vitamins, herbs, eye drops, creams, and over-the-counter medicines. Any blood disorders you have. Any surgeries you have had. Any medical conditions you have. Whether you are pregnant or may be pregnant. What are the risks? Generally, this is a safe test. However, problems  may occur, including an allergic reaction to dye (contrast) that may be used during the test. What happens before the test? No specific preparation is needed. You may eat and drink normally. What happens during the test?  You will take off your clothes from the waist up and put on a hospital gown. Electrodes or electrocardiogram  (ECG)patches may be placed on your chest. The electrodes or patches are then connected to a device that monitors your heart rate and rhythm. You will lie down on a table for an ultrasound exam. A gel will be applied to your chest to help sound waves pass through your skin. A handheld device, called a transducer, will be pressed against your chest and moved over your heart. The transducer produces sound waves that travel to your heart and bounce back (or "echo" back) to the transducer. These sound waves will be captured in real-time and changed into images of your heart that can be viewed on a video monitor. The images will be recorded on a computer and reviewed by your health care provider. You may be asked to change positions or hold your breath for a short time. This makes it easier to get different views or better views of your heart. In some cases, you may receive contrast through an IV in one of your veins. This can improve the quality of the pictures from your heart. The procedure may vary among health care providers and hospitals. What can I expect after the test? You may return to your normal, everyday life, including diet, activities, and medicines, unless your health care provider tells you not to do that. Follow these instructions at home: It is up to you to get the results of your test. Ask your health care provider, or the department that is doing the test, when your results will be ready. Keep all follow-up visits. This is important. Summary An echocardiogram is a test that uses sound waves (ultrasound) to produce images of the heart. Images from an echocardiogram can provide important information about the size and shape of your heart, heart muscle function, heart valve function, and other possible heart problems. You do not need to do anything to prepare before this test. You may eat and drink normally. After the echocardiogram is completed, you may return to your normal, everyday  life, unless your health care provider tells you not to do that. This information is not intended to replace advice given to you by your health care provider. Make sure you discuss any questions you have with your health care provider. Document Revised: 04/04/2020 Document Reviewed: 04/04/2020 Elsevier Patient Education  2022 [employer].

## 2021-06-27 NOTE — Progress Notes (Signed)
Cardiology Consultation:    Date:  06/27/2021   ID:  Adriana Williams, DOB 16-Jul-1956, MRN [id number]  PCP:  [facility]  Cardiologist:  Jenne Campus, MD   Referring MD: [facility]   Chief Complaint  Patient presents with   Dizziness    History of Present Illness:    Adriana Williams is a 65 y.o. female who is being seen today for the evaluation of syncope at the request of Pa, [facility]  Past medical history significant for hyperlipidemia, thyroid disorder, she is deaf.  She comes today to my office because of episode of syncope in the summertime she woke up about 6:00 in the morning she wanted to go to the restroom she wanted to urinate.  She is making few steps and She now she fell down.  She strike her head on the back.  That prompted visit to the emergency room likely there was no intracranial injury.  She did not have seizures, she did not wet herself.  She was alert awake oriented x3 after that episode.  Similar situation happened few months later the same story in the morning she was trying to get up to the bathroom and became very dizzy she actually ended up sitting down on the ground but she did not sit for any injury, she did not passed out.  She never had situation like this before.  She denies have any palpitations overall she is doing well.  There is no chest pain tightness squeezing pressure burning chest.  She does not have family history of premature cardiac death.  She does not smoke, never did.  Past Medical History:  Diagnosis Date   Hyperlipidemia    Thyroid disease     Past Surgical History:  Procedure Laterality Date   NO PAST SURGERIES      Current Medications: Current Meds  Medication Sig   EUTHYROX 75 MCG tablet Take 75 mcg by mouth every morning.   pravastatin (PRAVACHOL) 40 MG tablet Take 40 mg by mouth at bedtime.   SUMAtriptan (IMITREX) 100 MG tablet Take 100 mg by mouth daily.   [DISCONTINUED]  estradiol (ESTRACE) 0.1 MG/GM vaginal cream Place 1 Applicatorful vaginally See admin instructions. Apply pea size amount vaginally 2-3 times a week     Allergies:   Patient has no known allergies.   Social History   Socioeconomic History   Marital status: Single    Spouse name: Not on file   Number of children: Not on file   Years of education: Not on file   Highest education level: Not on file  Occupational History   Not on file  Tobacco Use   Smoking status: Never   Smokeless tobacco: Never  Substance and Sexual Activity   Alcohol use: Not Currently   Drug use: Never   Sexual activity: Not on file  Other Topics Concern   Not on file  Social History Narrative   Not on file   Social Determinants of Health   Financial Resource Strain: Not on file  Food Insecurity: Not on file  Transportation Needs: Not on file  Physical Activity: Not on file  Stress: Not on file  Social Connections: Not on file     Family History: The patient's family history includes Heart disease in her maternal grandfather and another family member. ROS:   Please see the history of present illness.    All 14 point review of systems negative except as described per  history of present illness.  EKGs/Labs/Other Studies Reviewed:    The following studies were reviewed today: Record from the emergency room reviewed.  EKG:  EKG is  ordered today.  The ekg ordered today demonstrates normal sinus rhythm, normal P interval, normal QS complex duration fulgent no ST segment changes  Recent Labs: 04/22/2021: ALT 16; BUN 17; Creatinine, Ser 0.87; Hemoglobin 12.9; Magnesium 2.3; Platelets 185; Potassium 4.5; Sodium 139  Recent Lipid Panel No results found for: CHOL, TRIG, HDL, CHOLHDL, VLDL, LDLCALC, LDLDIRECT  Physical Exam:    VS:  BP 120/72 (BP Location: Right Arm, Patient Position: Sitting)   Pulse 93   Ht 5\' 5"  (1.651 m)   Wt 162 lb (73.5 kg)   SpO2 95%   BMI 26.96 kg/m     Wt Readings from  Last 3 Encounters:  06/27/21 162 lb (73.5 kg)  04/20/21 148 lb (67.1 kg)     GEN:  Well nourished, well developed in no acute distress HEENT: Normal NECK: No JVD; No carotid bruits LYMPHATICS: No lymphadenopathy CARDIAC: RRR, no murmurs, no rubs, no gallops RESPIRATORY:  Clear to auscultation without rales, wheezing or rhonchi  ABDOMEN: Soft, non-tender, non-distended MUSCULOSKELETAL:  No edema; No deformity  SKIN: Warm and dry NEUROLOGIC:  Alert and oriented x 3 PSYCHIATRIC:  Normal affect   ASSESSMENT:    1. Syncope, unspecified syncope type   2. Syncope and collapse   3. Pure hypercholesterolemia   4. Acquired hypothyroidism    PLAN:    In order of problems listed above:  Syncope looks like probably orthostatic.  She does not have any orthostatic changes today.  But I suspect probably dehydration and early morning hours need to go to the restroom.  Problem.  Still have to make sure there is no arrhythmia component.  Therefore, I will ask her to wear Zio patch for 2 weeks to make sure there is no significant arrhythmia.  Supportive evaluation I will do echocardiogram to make sure that structurally her heart is normal. Dyslipidemia I did review her K PN which show LDL of 110, HDL 59.  We will discuss potential options for this problem Now only diet and exercise. Hypothyroidism.  Her last TSH was 0.6.  Followed by antimedicine team   Medication Adjustments/Labs and Tests Ordered: Current medicines are reviewed at length with the patient today.  Concerns regarding medicines are outlined above.  Orders Placed This Encounter  Procedures   LONG TERM MONITOR (3-14 DAYS)   EKG 12-Lead   ECHOCARDIOGRAM COMPLETE   No orders of the defined types were placed in this encounter.   Signed, Park Liter, MD, [facility]. 06/27/2021 12:07 PM    [place]

## 2021-07-20 DIAGNOSIS — R55 Syncope and collapse: Secondary | ICD-10-CM | POA: Diagnosis not present

## 2021-07-25 ENCOUNTER — Ambulatory Visit (INDEPENDENT_AMBULATORY_CARE_PROVIDER_SITE_OTHER): Payer: 59 | Admitting: Obstetrics & Gynecology

## 2021-07-25 ENCOUNTER — Encounter: Payer: Self-pay | Admitting: Obstetrics & Gynecology

## 2021-07-25 ENCOUNTER — Other Ambulatory Visit: Payer: Self-pay

## 2021-07-25 ENCOUNTER — Other Ambulatory Visit (HOSPITAL_COMMUNITY)
Admission: RE | Admit: 2021-07-25 | Discharge: 2021-07-25 | Disposition: A | Discharge status: Home / Self Care | Payer: 59 | Source: Ambulatory Visit | Attending: Obstetrics & Gynecology | Admitting: Obstetrics & Gynecology

## 2021-07-25 VITALS — BP 141/92 | HR 68 | Ht 65.0 in | Wt 158.2 lb

## 2021-07-25 DIAGNOSIS — Z1151 Encounter for screening for human papillomavirus (HPV): Secondary | ICD-10-CM | POA: Insufficient documentation

## 2021-07-25 DIAGNOSIS — Z01419 Encounter for gynecological examination (general) (routine) without abnormal findings: Secondary | ICD-10-CM | POA: Diagnosis not present

## 2021-07-25 DIAGNOSIS — N952 Postmenopausal atrophic vaginitis: Secondary | ICD-10-CM | POA: Diagnosis not present

## 2021-07-25 MED ORDER — ESTRADIOL 0.1 MG/GM VA CREA
0.5000 g | TOPICAL_CREAM | Freq: Every day | VAGINAL | 2 refills | Status: AC
Start: 2021-07-25 — End: 2021-08-24

## 2021-07-25 NOTE — Progress Notes (Signed)
   WELL-WOMAN EXAMINATION Patient name: Adriana Williams MRN [id number]  Date of birth: 1955/09/05 Chief Complaint:   Gynecologic Exam  History of Present Illness:   Adriana Williams is a 65 y.o. PM female being seen today for a routine well-woman exam.   Vaginal atrophy: Using estrace cream every 4th night and that has been working well for her.  Denies vaginal discomfort or dryness- notes significant improvement.  Denies vaginal bleeding, discharge, itching or irritation.  Denies pelvic or abdominal pain.  Overall doing well and reports no acute complaints  Pt hearing impaired- interpreter present  No LMP recorded. Patient is postmenopausal.  Last pap 2018.  Last mammogram: 12/2020. Last colonoscopy: per pt completed in [place]  Depression screen [facility] 2/9 07/25/2021  Decreased Interest 0  Down, Depressed, Hopeless 0  PHQ - 2 Score 0  Altered sleeping 0  Tired, decreased energy 0  Change in appetite 0  Feeling bad or failure about yourself  0  Trouble concentrating 0  Moving slowly or fidgety/restless 0  Suicidal thoughts 0  PHQ-9 Score 0     Review of Systems:   Pertinent items are noted in HPI Denies any headaches, blurred vision, fatigue, shortness of breath, chest pain, abdominal pain, bowel movements, urination, or intercourse unless otherwise stated above.  Pertinent History Reviewed:  Reviewed past medical,surgical, social and family history.  Reviewed problem list, medications and allergies. Physical Assessment:   Vitals:   07/25/21 1556  BP: (!) 141/92  Pulse: 68  Weight: 158 lb 3.2 oz (71.8 kg)  Height: 5\' 5"  (1.651 m)  Body mass index is 26.33 kg/m.        Physical Examination:   General appearance - well appearing, and in no distress  Mental status - alert, oriented to person, place, and time  Psych:  She has a normal mood and affect  Skin - warm and dry, normal color, no suspicious lesions noted  Chest - effort normal, all lung fields clear to  auscultation bilaterally  Heart - normal rate and regular rhythm  Neck:  midline trachea, no thyromegaly or nodules  Breasts - breasts appear normal, no suspicious masses, no skin or nipple changes or  axillary nodes  Abdomen - soft, nontender, nondistended, no masses or organomegaly  Pelvic - VULVA: normal appearing vulva with no masses, tenderness or lesions  VAGINA: normal appearing vagina with normal color and discharge, no lesions  CERVIX: normal appearing cervix without discharge or lesions, no CMT  Thin prep pap is done with HR HPV cotesting  UTERUS: uterus is felt to be normal size, shape, consistency and nontender   ADNEXA: No adnexal masses or tenderness noted.  Extremities:  No swelling or varicosities noted  Chaperone:  interpreter present      Assessment & Plan:  1) Well-Woman Exam -pap collected, reviewed guidelines- if negative today no further testing indicated  2) Vaginal atrophy/Dyspareunia -doing well with estrace cream and plan to continue  Meds:  Meds ordered this encounter  Medications   estradiol (ESTRACE) 0.1 MG/GM vaginal cream    Sig: Place 0.5 g vaginally at bedtime. Every 4 days.    Dispense:  42.5 g    Refill:  2     Follow-up: Return in about 1 year (around 07/25/2022) for Annual, with Dr. Nelda Marseille.   Janyth Pupa, DO Attending [place], [facility] for [employer], [place]

## 2021-07-27 ENCOUNTER — Other Ambulatory Visit (HOSPITAL_BASED_OUTPATIENT_CLINIC_OR_DEPARTMENT_OTHER): Payer: 59

## 2021-07-30 LAB — CYTOLOGY - PAP
Adequacy: ABSENT
Comment: NEGATIVE
Diagnosis: NEGATIVE
Diagnosis: REACTIVE
High risk HPV: NEGATIVE

## 2021-08-01 ENCOUNTER — Ambulatory Visit (HOSPITAL_BASED_OUTPATIENT_CLINIC_OR_DEPARTMENT_OTHER)
Admission: RE | Admit: 2021-08-01 | Discharge: 2021-08-01 | Disposition: A | Discharge status: Home / Self Care | Payer: 59 | Source: Ambulatory Visit | Attending: Cardiology | Admitting: Cardiology

## 2021-08-01 ENCOUNTER — Other Ambulatory Visit: Payer: Self-pay

## 2021-08-01 DIAGNOSIS — R55 Syncope and collapse: Secondary | ICD-10-CM | POA: Insufficient documentation

## 2021-08-03 LAB — ECHOCARDIOGRAM COMPLETE
AR max vel: 3.14 cm2
AV Area VTI: 2.98 cm2
AV Area mean vel: 3.17 cm2
AV Mean grad: 3 mmHg
AV Peak grad: 5.1 mmHg
Ao pk vel: 1.13 m/s
Area-P 1/2: 2.33 cm2
Calc EF: 60.4 %
P 1/2 time: 1304 msec
S' Lateral: 3.2 cm
Single Plane A2C EF: 66.7 %
Single Plane A4C EF: 52.1 %

## 2021-08-07 ENCOUNTER — Telehealth: Payer: Self-pay | Admitting: Cardiology

## 2021-08-07 NOTE — Telephone Encounter (Signed)
Patient and interpreter are returning call to discuss lab results.

## 2021-08-07 NOTE — Telephone Encounter (Signed)
Patient informed of results.  

## 2021-08-13 ENCOUNTER — Other Ambulatory Visit: Payer: Self-pay | Admitting: *Deleted

## 2021-08-13 DIAGNOSIS — N952 Postmenopausal atrophic vaginitis: Secondary | ICD-10-CM

## 2021-08-15 ENCOUNTER — Telehealth: Payer: Self-pay | Admitting: Obstetrics & Gynecology

## 2021-08-15 NOTE — Telephone Encounter (Signed)
Patient needs her estradiol prescription transferred from  [facility] to [facility]. The address is [address], [address] [place] [address] and the phone number is [phone].

## 2021-08-15 NOTE — Telephone Encounter (Signed)
Called pharmacy to transfer medication. Pharmacist stated that it had been transferred on 07/28/21.   Called pt and relayed that information with the help of a translator. Pt confirmed understanding.

## 2021-08-21 ENCOUNTER — Other Ambulatory Visit: Payer: Self-pay | Admitting: Obstetrics & Gynecology

## 2021-09-19 DIAGNOSIS — E785 Hyperlipidemia, unspecified: Secondary | ICD-10-CM | POA: Insufficient documentation

## 2021-09-25 ENCOUNTER — Encounter: Payer: Self-pay | Admitting: Cardiology

## 2021-09-25 ENCOUNTER — Ambulatory Visit (INDEPENDENT_AMBULATORY_CARE_PROVIDER_SITE_OTHER): Payer: 59 | Admitting: Cardiology

## 2021-09-25 ENCOUNTER — Other Ambulatory Visit: Payer: Self-pay

## 2021-09-25 VITALS — BP 132/80 | HR 76 | Ht 65.0 in | Wt 161.0 lb

## 2021-09-25 DIAGNOSIS — I340 Nonrheumatic mitral (valve) insufficiency: Secondary | ICD-10-CM

## 2021-09-25 DIAGNOSIS — I351 Nonrheumatic aortic (valve) insufficiency: Secondary | ICD-10-CM

## 2021-09-25 DIAGNOSIS — R55 Syncope and collapse: Secondary | ICD-10-CM | POA: Diagnosis not present

## 2021-09-25 DIAGNOSIS — H905 Unspecified sensorineural hearing loss: Secondary | ICD-10-CM | POA: Diagnosis not present

## 2021-09-25 DIAGNOSIS — E7800 Pure hypercholesterolemia, unspecified: Secondary | ICD-10-CM | POA: Diagnosis not present

## 2021-09-25 NOTE — Patient Instructions (Signed)
Medication Instructions:   *If you need a refill on your cardiac medications before your next appointment, please call your pharmacy*   Lab Work: None If you have labs (blood work) drawn today and your tests are completely normal, you will receive your results only by: [place] (if you have MyChart) OR A paper copy in the mail If you have any lab test that is abnormal or we need to change your treatment, we will call you to review the results.   Testing/Procedures: None   Follow-Up: At [facility], you and your health needs are our priority.  As part of our continuing mission to provide you with exceptional heart care, we have created designated Provider Care Teams.  These Care Teams include your primary Cardiologist (physician) and Advanced Practice Providers (APPs -  Physician Assistants and Nurse Practitioners) who all work together to provide you with the care you need, when you need it.  We recommend signing up for the patient portal called "MyChart".  Sign up information is provided on this After Visit Summary.  MyChart is used to connect with patients for Virtual Visits (Telemedicine).  Patients are able to view lab/test results, encounter notes, upcoming appointments, etc.  Non-urgent messages can be sent to your provider as well.   To learn more about what you can do with MyChart, go to [email or web address].    Your next appointment:   1 year(s)  The format for your next appointment:   In Person  Provider:       Other Instructions None

## 2021-09-25 NOTE — Progress Notes (Signed)
Cardiology Office Note:    Date:  09/25/2021   ID:  Adriana Williams, DOB [birth year], MRN [id number]  PCP:  [facility]  Cardiologist:  Jenne Campus, MD    Referring MD: [facility]   No chief complaint on file. Doing fine  History of Present Illness:    Adriana Williams is a 66 y.o. female she came to our office for follow-up and she was sent to my office because of episode of syncope.  It happened early morning when she was trying to go to the restroom it happened twice.  She did not injure herself.  We did echocardiogram which showed normal left ventricle ejection fraction mild AI mild MR hemodynamically insignificant, she did wear a monitor which showed short runs of supraventricular tachycardia which were perfectly asymptomatic, no significant cardiac arrhythmias.  Overall she is doing well denies have any chest pain tightness squeezing pressure burning chest no shortness of breath  Past Medical History:  Diagnosis Date   Hyperlipidemia    Thyroid disease     Past Surgical History:  Procedure Laterality Date   NO PAST SURGERIES      Current Medications: Current Meds  Medication Sig   acetaminophen (TYLENOL) 500 MG tablet Take 500 mg by mouth every 6 (six) hours as needed.   cyclobenzaprine (FLEXERIL) 10 MG tablet Take 10 mg by mouth daily as needed.   EUTHYROX 75 MCG tablet Take 75 mcg by mouth every morning.   fluticasone (FLONASE) 50 MCG/ACT nasal spray Place 2 sprays into both nostrils daily as needed.   loratadine (CLARITIN) 10 MG tablet Take 10 mg by mouth daily as needed.   pravastatin (PRAVACHOL) 40 MG tablet Take 40 mg by mouth at bedtime.   SUMAtriptan (IMITREX) 100 MG tablet Take 100 mg by mouth daily.     Allergies:   Patient has no known allergies.   Social History   Socioeconomic History   Marital status: Married    Spouse name: Not on file   Number of children: Not on file   Years of education: Not on file    Highest education level: Not on file  Occupational History   Not on file  Tobacco Use   Smoking status: Never    Passive exposure: Never   Smokeless tobacco: Never  Vaping Use   Vaping Use: Never used  Substance and Sexual Activity   Alcohol use: Not Currently   Drug use: Never   Sexual activity: Yes    Birth control/protection: None, Post-menopausal  Other Topics Concern   Not on file  Social History Narrative   Not on file   Social Determinants of Health   Financial Resource Strain: Not on file  Food Insecurity: Not on file  Transportation Needs: Not on file  Physical Activity: Not on file  Stress: Not on file  Social Connections: Not on file     Family History: The patient's family history includes Heart disease in her maternal grandfather and another family member. ROS:   Please see the history of present illness.    All 14 point review of systems negative except as described per history of present illness  EKGs/Labs/Other Studies Reviewed:      Recent Labs: 04/22/2021: ALT 16; BUN 17; Creatinine, Ser 0.87; Hemoglobin 12.9; Magnesium 2.3; Platelets 185; Potassium 4.5; Sodium 139  Recent Lipid Panel No results found for: CHOL, TRIG, HDL, CHOLHDL, VLDL, LDLCALC, LDLDIRECT  Physical Exam:    VS:  BP 132/80 (BP  Location: Right Arm)    Pulse 76    Ht 5\' 5"  (1.651 m)    Wt 161 lb (73 kg)    SpO2 97%    BMI 26.79 kg/m     Wt Readings from Last 3 Encounters:  09/25/21 161 lb (73 kg)  07/25/21 158 lb 3.2 oz (71.8 kg)  06/27/21 162 lb (73.5 kg)     GEN:  Well nourished, well developed in no acute distress HEENT: Normal NECK: No JVD; No carotid bruits LYMPHATICS: No lymphadenopathy CARDIAC: RRR, no murmurs, no rubs, no gallops RESPIRATORY:  Clear to auscultation without rales, wheezing or rhonchi  ABDOMEN: Soft, non-tender, non-distended MUSCULOSKELETAL:  No edema; No deformity  SKIN: Warm and dry LOWER EXTREMITIES: no swelling NEUROLOGIC:  Alert and oriented  x 3 PSYCHIATRIC:  Normal affect   ASSESSMENT:    1. Syncope and collapse   2. Pure hypercholesterolemia   3. Congenital hearing loss of both ears   4. Nonrheumatic aortic valve insufficiency   5. Nonrheumatic mitral valve regurgitation    PLAN:    In order of problems listed above:  Syncope I suspect is related to dehydrated in the early morning hours I asked her to make sure she drink plenty of fluids and if she feels syncope, she needs to relax sit down and wait until it passed. Dyslipidemia has been managed by primary care physician.  Reasonably well controlled continue present management. Mitral regurgitation aortic insufficiency hemodynamically insignificant I told her the only thing we need to do about this is repeat echocardiogram periodically but otherwise there is no restriction for her to do exercises from my standpoint reviewed.   Medication Adjustments/Labs and Tests Ordered: Current medicines are reviewed at length with the patient today.  Concerns regarding medicines are outlined above.  No orders of the defined types were placed in this encounter.  Medication changes: No orders of the defined types were placed in this encounter.   Signed, Park Liter, MD, [facility] 09/25/2021 11:37 AM    [place]

## 2021-10-01 DIAGNOSIS — M2041 Other hammer toe(s) (acquired), right foot: Secondary | ICD-10-CM | POA: Diagnosis not present

## 2021-10-01 DIAGNOSIS — M21611 Bunion of right foot: Secondary | ICD-10-CM | POA: Diagnosis not present

## 2021-10-05 DIAGNOSIS — M25522 Pain in left elbow: Secondary | ICD-10-CM | POA: Diagnosis not present

## 2021-10-12 DIAGNOSIS — M25522 Pain in left elbow: Secondary | ICD-10-CM | POA: Diagnosis not present

## 2021-10-15 DIAGNOSIS — R234 Changes in skin texture: Secondary | ICD-10-CM | POA: Diagnosis not present

## 2021-10-15 DIAGNOSIS — M2011 Hallux valgus (acquired), right foot: Secondary | ICD-10-CM | POA: Diagnosis not present

## 2021-10-15 DIAGNOSIS — M2041 Other hammer toe(s) (acquired), right foot: Secondary | ICD-10-CM | POA: Diagnosis not present

## 2021-10-15 DIAGNOSIS — M2042 Other hammer toe(s) (acquired), left foot: Secondary | ICD-10-CM | POA: Diagnosis not present

## 2021-10-15 DIAGNOSIS — M2012 Hallux valgus (acquired), left foot: Secondary | ICD-10-CM | POA: Diagnosis not present

## 2021-10-31 DIAGNOSIS — M25521 Pain in right elbow: Secondary | ICD-10-CM | POA: Diagnosis not present

## 2021-10-31 DIAGNOSIS — M25522 Pain in left elbow: Secondary | ICD-10-CM | POA: Diagnosis not present

## 2021-11-21 DIAGNOSIS — M25522 Pain in left elbow: Secondary | ICD-10-CM | POA: Diagnosis not present

## 2021-12-05 DIAGNOSIS — R42 Dizziness and giddiness: Secondary | ICD-10-CM | POA: Diagnosis not present

## 2021-12-05 DIAGNOSIS — R300 Dysuria: Secondary | ICD-10-CM | POA: Diagnosis not present

## 2021-12-05 DIAGNOSIS — R069 Unspecified abnormalities of breathing: Secondary | ICD-10-CM | POA: Diagnosis not present

## 2021-12-27 ENCOUNTER — Other Ambulatory Visit: Payer: Self-pay | Admitting: Family Medicine

## 2021-12-27 DIAGNOSIS — Z1231 Encounter for screening mammogram for malignant neoplasm of breast: Secondary | ICD-10-CM

## 2021-12-28 DIAGNOSIS — J029 Acute pharyngitis, unspecified: Secondary | ICD-10-CM | POA: Diagnosis not present

## 2022-01-22 ENCOUNTER — Ambulatory Visit
Admission: RE | Admit: 2022-01-22 | Discharge: 2022-01-22 | Disposition: A | Discharge status: Home / Self Care | Payer: Medicare Other | Source: Ambulatory Visit | Attending: Family Medicine | Admitting: Family Medicine

## 2022-01-22 DIAGNOSIS — Z1231 Encounter for screening mammogram for malignant neoplasm of breast: Secondary | ICD-10-CM

## 2022-02-06 DIAGNOSIS — M25561 Pain in right knee: Secondary | ICD-10-CM | POA: Diagnosis not present

## 2022-03-29 DIAGNOSIS — H9202 Otalgia, left ear: Secondary | ICD-10-CM | POA: Diagnosis not present

## 2022-03-29 DIAGNOSIS — J309 Allergic rhinitis, unspecified: Secondary | ICD-10-CM | POA: Diagnosis not present

## 2022-03-29 DIAGNOSIS — R350 Frequency of micturition: Secondary | ICD-10-CM | POA: Diagnosis not present

## 2022-04-03 DIAGNOSIS — N760 Acute vaginitis: Secondary | ICD-10-CM | POA: Diagnosis not present

## 2022-04-03 DIAGNOSIS — Z113 Encounter for screening for infections with a predominantly sexual mode of transmission: Secondary | ICD-10-CM | POA: Diagnosis not present

## 2022-06-21 ENCOUNTER — Ambulatory Visit (INDEPENDENT_AMBULATORY_CARE_PROVIDER_SITE_OTHER): Payer: 59 | Admitting: Podiatry

## 2022-06-21 ENCOUNTER — Ambulatory Visit (INDEPENDENT_AMBULATORY_CARE_PROVIDER_SITE_OTHER): Payer: 59

## 2022-06-21 ENCOUNTER — Encounter: Payer: Self-pay | Admitting: Podiatry

## 2022-06-21 DIAGNOSIS — M2041 Other hammer toe(s) (acquired), right foot: Secondary | ICD-10-CM | POA: Diagnosis not present

## 2022-06-21 DIAGNOSIS — M2011 Hallux valgus (acquired), right foot: Secondary | ICD-10-CM | POA: Diagnosis not present

## 2022-06-21 NOTE — Patient Instructions (Signed)
Keep a small amount of antibiotic ointment and bandage on the 2nd toe. Watch for any signs of infection. If anything changes, please let me know  Hammer Toe Hammer toe is a change in the shape, or a deformity, of the toe. The deformity causes the middle joint of the toe to stay bent. Hammer toe starts gradually. At first, the toe can be straightened. Then over time, the toe deformity becomes stiff, inflexible, and permanently bent. Hammer toe usually affects the second, third, or fourth toe. A hammer toe causes pain, especially when wearing shoes. Corns and calluses can result from the toe rubbing against the inside of the shoe. Early treatments to keep the toe straight may relieve pain. As the deformity of the toe becomes stiff and permanent, surgery may be needed to straighten the toe. What are the causes? This condition is caused by abnormal bending of the toe joint that is closest to your foot. Over time, the toe bending downward pulls on the muscles and connections (tendons) of the toe joint, making them weak and stiff. Wearing shoes that are too narrow in the toe box and do not allow toes to fully straighten can cause this condition. What increases the risk? You are more likely to develop this condition if you: Are an older female. Wear shoes that are too small, or wear high-heeled shoes that pinch your toes. Have a second toe that is longer than your big toe (first toe). Injure your foot or toe. Have arthritis, or have a nerve or muscle disorder. Have diabetes or a condition known as Charcot joint, which may cause you to walk abnormally. Have a family history of hammer toe. Are a Engineer, mining. What are the signs or symptoms? Pain and deformity of the toe are the main symptoms of this condition. The pain is worse when wearing shoes, walking, or running. Other symptoms may include: A thickened patch of skin, called a corn or callus, that forms over the top of the bent part of the toe or  between the toes. Redness and a burning feeling on the bent toe. An open sore that forms on the top of the bent toe. Not being able to straighten the affected toe. How is this diagnosed? This condition is diagnosed based on your symptoms and a physical exam. During the exam, your health care provider will try to straighten your toe to see how stiff the deformity is. You may also have tests, such as: A blood test to check for rheumatoid arthritis or diabetes. An X-ray to show how severe the toe deformity is. How is this treated? Treatment for this condition depends on whether the toe is flexible or deformed and no longer moveable. In less severe cases, a hammer toe can be straightened without surgery. These treatments include: Taping the toe into a straightened position. Using pads and cushions to protect the bent toe. Wearing shoes that provide enough room for the toes. Doing toe-stretching exercises at home. Taking an NSAID, such as ibuprofen, to reduce pain and swelling. Using special orthotics or insoles for pain relief and to improve walking. If these treatments do not help or the toe has a severe deformity and cannot be straightened, surgery is the next option. The most common surgeries used to straighten a hammer toe include: Arthroplasty or osteotomy. Part of the toe joint is reconstructed or removed, which allows the toe to straighten. Fusion. Cartilage between the two bones of the joint is taken out, and the bones are fused  together into one longer bone. Implantation. Part of the bone is removed and replaced with an implant to allow the toe to move again. Flexor tendon transfer. The tendons that curl the toes down (flexor tendons) are repositioned. Follow these instructions at home: Take over-the-counter and prescription medicines only as told by your health care provider. Do toe-straightening and stretching exercises as told by your health care provider. Keep all follow-up visits.  This is important. How is this prevented? Wear shoes that fit properly and give your toes enough room. Shoes should not cause pain. Buy shoes at the end of the day to make sure they fit well, since your foot may swell during the day. Make sure they are comfortable before you buy them. As you age, your shoe size might change, including the width. Measure both feet and buy shoes for the larger foot. A shoe repair store might be able to stretch shoes that feel tight in spots. Do not wear high-heeled shoes or shoes with pointed toes. Contact a health care provider if: Your pain gets worse. Your toe becomes red or swollen. You develop an open sore on your toe. Summary Hammer toe is a condition that gradually causes your toe to become bent and stiff. Hammer toe can be treated by taping the toe into a straightened position and doing toe-stretching exercises. If these treatments do not help, surgery may be needed. To prevent this condition, wear shoes that fit properly, give your toes enough room, and do not cause pain. This information is not intended to replace advice given to you by your health care provider. Make sure you discuss any questions you have with your health care provider. Document Revised: 11/18/2019 Document Reviewed: 11/18/2019 Elsevier Patient Education  [place] A bunion (hallux valgus) is a bump that forms slowly on the inner side of the big toe joint. It occurs when the big toe turns toward the second toe. Bunions may be small at first, but they often get larger over time. They can make walking painful. What are the causes? This condition may be caused by: Wearing narrow or pointed shoes that force the big toe to press against the other toes. Abnormal foot development that causes the foot to roll inward. Changes in the foot that are caused by certain diseases, such as rheumatoid arthritis or polio. A foot injury. What increases the risk? The following  factors may make you more likely to develop this condition: Wearing shoes that squeeze the toes together. Having certain diseases, such as: Rheumatoid arthritis. Polio. Cerebral palsy. Having family members who have bunions. Being born with abnormally shaped feet (a foot deformity), such as flat feet or low arches. Doing activities that put a lot of pressure on the feet, such as ballet dancing. What are the signs or symptoms?  The main symptom of this condition is a bump on your big toe that you can notice. Other symptoms may include: Pain. Redness and inflammation around your big toe. Thick or hardened skin on your big toe or between your toes. Stiffness or loss of motion in your big toe. Trouble with walking. How is this diagnosed? This condition may be diagnosed based on your symptoms, medical history, and activities. You may also have tests and imaging, such as: X-rays. These allow your health care provider to check the position of the bones in your foot and look for damage to your joint. They also help your health care provider determine the severity of your bunion and  the best way to treat it. Joint aspiration. In this test, a sample of fluid is removed from the toe joint. This test may be done if you are in a lot of pain. It helps rule out diseases that cause painful swelling of the joints, such as arthritis or gout. How is this treated? Treatment depends on the severity of your symptoms. The goal of treatment is to relieve symptoms and prevent your bunion from getting worse. Your health care provider may recommend: Wearing shoes that have a wide toe box, or using bunion pads to cushion the affected area. Taping your toes together to keep them in a normal position. Placing a device inside your shoe (orthotic device) to help reduce pressure on your toe joint. Taking medicine to ease pain and inflammation. Putting ice or heat on the affected area. Doing stretching  exercises. Surgery, for severe cases. Follow these instructions at home: Managing pain, stiffness, and swelling     If directed, put ice on the painful area. To do this: Put ice in a plastic bag. Place a towel between your skin and the bag. Leave the ice on for 20 minutes, 2-3 times a day. Remove the ice if your skin turns bright red. This is very important. If you cannot feel pain, heat, or cold, you have a greater risk of damage to the area. If directed, apply heat to the affected area before you exercise. Use the heat source that your health care provider recommends, such as a moist heat pack or a heating pad. Place a towel between your skin and the heat source. Leave the heat on for 20-30 minutes. Remove the heat if your skin turns bright red. This is especially important if you are unable to feel pain, heat, or cold. You have a greater risk of getting burned. General instructions Do exercises as told by your health care provider. Support your toe joint with proper footwear, shoe padding, or taping as told by your health care provider. Take over-the-counter and prescription medicines only as told by your health care provider. Do not use any products that contain nicotine or tobacco, such as cigarettes, e-cigarettes, and chewing tobacco. If you need help quitting, ask your health care provider. Keep all follow-up visits. This is important. Contact a health care provider if: Your symptoms get worse. Your symptoms do not improve in 2 weeks. Get help right away if: You have severe pain and trouble with walking. Summary A bunion is a bump on the inner side of the big toe joint that forms when the big toe turns toward the second toe. Bunions can make walking painful. Treatment depends on the severity of your symptoms. Support your toe joint with proper footwear, shoe padding, or taping as told by your health care provider. This information is not intended to replace advice given to you  by your health care provider. Make sure you discuss any questions you have with your health care provider. Document Revised: 12/17/2019 Document Reviewed: 12/17/2019 Elsevier Patient Education  [place].

## 2022-06-24 NOTE — Progress Notes (Signed)
Subjective:   Patient ID: Adriana Williams, female   DOB: 66 y.o.   MRN: [id number]   HPI Chief Complaint  Patient presents with   Toe Pain    2nd toe and 1st MPJ right - hammer toe and bunion deformity x years, starting to ache more, toe rubs shoes causing small corn   New Patient (Initial Visit)   66 year old female presents the office with interpreter for concerns of recurrent corn on the top of the second toe.  She states the toe rubs inside of her shoes causing discomfort.  Is been on that for many years.  She previously did see other provider for this and the area was trimmed however has come back.  She states starting to ache more.  No open lesions that she reports.  No injuries.   Review of Systems  All other systems reviewed and are negative.  Past Medical History:  Diagnosis Date   Hyperlipidemia    Thyroid disease     Past Surgical History:  Procedure Laterality Date   NO PAST SURGERIES       Current Outpatient Medications:    acetaminophen (TYLENOL) 500 MG tablet, Take 500 mg by mouth every 6 (six) hours as needed., Disp: , Rfl:    cyclobenzaprine (FLEXERIL) 10 MG tablet, Take 10 mg by mouth daily as needed., Disp: , Rfl:    EUTHYROX 75 MCG tablet, Take 75 mcg by mouth every morning., Disp: , Rfl:    fluticasone (FLONASE) 50 MCG/ACT nasal spray, Place 2 sprays into both nostrils daily as needed., Disp: , Rfl:    lisinopril (ZESTRIL) 5 MG tablet, Take 5 mg by mouth daily., Disp: , Rfl:    loratadine (CLARITIN) 10 MG tablet, Take 10 mg by mouth daily as needed., Disp: , Rfl:    pravastatin (PRAVACHOL) 40 MG tablet, Take 40 mg by mouth at bedtime., Disp: , Rfl:    SUMAtriptan (IMITREX) 100 MG tablet, Take 100 mg by mouth daily., Disp: , Rfl:   No Known Allergies        Objective:  Physical Exam  General: AAO x3, NAD  Dermatological: Hyperkeratotic lesion noted on the dorsal aspect of the right second toe.  No underlying ulceration.  No open lesions  otherwise.  Vascular: Dorsalis Pedis artery and Posterior Tibial artery pedal pulses are 2/4 bilateral with immedate capillary fill time. There is no pain with calf compression, swelling, warmth, erythema.   Neruologic: Grossly intact via light touch bilateral.   Musculoskeletal: Moderate bunions present in the right foot as well as rigid hammertoe of the second toe which is resulted in the callus formation.  Tenderness palpation is along the second toe.  Muscular strength 5/5 in all groups tested bilateral.  Gait: Unassisted, Nonantalgic.       Assessment:   66 year old female with right foot bunion, hammertoe deformity resulted in hyperkeratotic lesion second toe     Plan:  -Treatment options discussed including all alternatives, risks, and complications -Etiology of symptoms were discussed -X-rays were obtained and reviewed with the patient.  3 views right foot were obtained.  Moderate bunion is present the right foot with hammertoe contracture present.  No evidence of acute fracture. -Sharply debrided hyperkeratotic lesion and small amount of bleeding occurred.  No laceration noted.  Clean the area and antibiotic ointment and a bandage was applied.  Monitor for any signs or symptoms of infection. -We discussed both conservative as well as surgical treatment options.  Unfortunate effects of the second toe  would also need to fix the bunion.  We discussed the surgery as well as postoperative course and she is going to think about this.  In the meantime I dispensed offloading pads to help.  Discussed shoe modifications and she is in extra-depth shoe to help avoid pressure to the toe.   Trula Slade DPM

## 2022-06-26 DIAGNOSIS — M545 Low back pain, unspecified: Secondary | ICD-10-CM | POA: Diagnosis not present

## 2022-07-03 DIAGNOSIS — M5416 Radiculopathy, lumbar region: Secondary | ICD-10-CM | POA: Diagnosis not present

## 2022-07-03 DIAGNOSIS — S39012D Strain of muscle, fascia and tendon of lower back, subsequent encounter: Secondary | ICD-10-CM | POA: Diagnosis not present

## 2022-07-03 DIAGNOSIS — M6281 Muscle weakness (generalized): Secondary | ICD-10-CM | POA: Diagnosis not present

## 2022-07-09 DIAGNOSIS — Z0000 Encounter for general adult medical examination without abnormal findings: Secondary | ICD-10-CM | POA: Diagnosis not present

## 2022-07-09 DIAGNOSIS — N898 Other specified noninflammatory disorders of vagina: Secondary | ICD-10-CM | POA: Diagnosis not present

## 2022-07-09 DIAGNOSIS — E7800 Pure hypercholesterolemia, unspecified: Secondary | ICD-10-CM | POA: Diagnosis not present

## 2022-07-11 DIAGNOSIS — M5416 Radiculopathy, lumbar region: Secondary | ICD-10-CM | POA: Diagnosis not present

## 2022-07-11 DIAGNOSIS — S39012D Strain of muscle, fascia and tendon of lower back, subsequent encounter: Secondary | ICD-10-CM | POA: Diagnosis not present

## 2022-07-11 DIAGNOSIS — M6281 Muscle weakness (generalized): Secondary | ICD-10-CM | POA: Diagnosis not present

## 2022-08-02 DIAGNOSIS — Z1151 Encounter for screening for human papillomavirus (HPV): Secondary | ICD-10-CM | POA: Diagnosis not present

## 2022-08-02 DIAGNOSIS — Z124 Encounter for screening for malignant neoplasm of cervix: Secondary | ICD-10-CM | POA: Diagnosis not present

## 2022-08-02 DIAGNOSIS — R87810 Cervical high risk human papillomavirus (HPV) DNA test positive: Secondary | ICD-10-CM | POA: Diagnosis not present

## 2022-08-02 DIAGNOSIS — R87619 Unspecified abnormal cytological findings in specimens from cervix uteri: Secondary | ICD-10-CM | POA: Diagnosis not present

## 2022-08-02 DIAGNOSIS — N898 Other specified noninflammatory disorders of vagina: Secondary | ICD-10-CM | POA: Diagnosis not present

## 2022-08-08 ENCOUNTER — Telehealth: Payer: Self-pay | Admitting: Podiatry

## 2022-08-08 NOTE — Telephone Encounter (Signed)
Pt had left message yesterday afternoon needing an appt with Dr Jacqualyn Posey.  I returned call and pt is scheduled to see Dr Jacqualyn Posey to discuss possible surgery in January.

## 2022-08-28 DIAGNOSIS — N72 Inflammatory disease of cervix uteri: Secondary | ICD-10-CM | POA: Diagnosis not present

## 2022-08-28 DIAGNOSIS — R87618 Other abnormal cytological findings on specimens from cervix uteri: Secondary | ICD-10-CM | POA: Diagnosis not present

## 2022-08-29 DIAGNOSIS — J019 Acute sinusitis, unspecified: Secondary | ICD-10-CM | POA: Diagnosis not present

## 2022-09-12 ENCOUNTER — Ambulatory Visit: Payer: Medicare Other | Admitting: Podiatry

## 2022-09-13 DIAGNOSIS — R87620 Atypical squamous cells of undetermined significance on cytologic smear of vagina (ASC-US): Secondary | ICD-10-CM | POA: Diagnosis not present

## 2022-09-13 DIAGNOSIS — R350 Frequency of micturition: Secondary | ICD-10-CM | POA: Diagnosis not present

## 2022-09-13 DIAGNOSIS — R87610 Atypical squamous cells of undetermined significance on cytologic smear of cervix (ASC-US): Secondary | ICD-10-CM | POA: Diagnosis not present

## 2022-09-20 DIAGNOSIS — M7741 Metatarsalgia, right foot: Secondary | ICD-10-CM | POA: Diagnosis not present

## 2022-09-20 DIAGNOSIS — M21611 Bunion of right foot: Secondary | ICD-10-CM | POA: Diagnosis not present

## 2022-09-20 DIAGNOSIS — M2041 Other hammer toe(s) (acquired), right foot: Secondary | ICD-10-CM | POA: Diagnosis not present

## 2022-10-02 DIAGNOSIS — J329 Chronic sinusitis, unspecified: Secondary | ICD-10-CM | POA: Diagnosis not present

## 2022-10-16 DIAGNOSIS — J209 Acute bronchitis, unspecified: Secondary | ICD-10-CM | POA: Diagnosis not present

## 2022-10-31 DIAGNOSIS — Z01810 Encounter for preprocedural cardiovascular examination: Secondary | ICD-10-CM | POA: Diagnosis not present

## 2022-10-31 DIAGNOSIS — Z01818 Encounter for other preprocedural examination: Secondary | ICD-10-CM | POA: Diagnosis not present

## 2022-10-31 DIAGNOSIS — M21619 Bunion of unspecified foot: Secondary | ICD-10-CM | POA: Diagnosis not present

## 2022-10-31 DIAGNOSIS — R109 Unspecified abdominal pain: Secondary | ICD-10-CM | POA: Diagnosis not present

## 2022-11-06 ENCOUNTER — Other Ambulatory Visit: Payer: Self-pay | Admitting: Physician Assistant

## 2022-11-06 DIAGNOSIS — R1012 Left upper quadrant pain: Secondary | ICD-10-CM

## 2022-11-15 DIAGNOSIS — J988 Other specified respiratory disorders: Secondary | ICD-10-CM | POA: Diagnosis not present

## 2022-11-15 DIAGNOSIS — K219 Gastro-esophageal reflux disease without esophagitis: Secondary | ICD-10-CM | POA: Diagnosis not present

## 2022-11-15 DIAGNOSIS — S29011A Strain of muscle and tendon of front wall of thorax, initial encounter: Secondary | ICD-10-CM | POA: Diagnosis not present

## 2022-11-18 ENCOUNTER — Ambulatory Visit
Admission: RE | Admit: 2022-11-18 | Discharge: 2022-11-18 | Disposition: A | Discharge status: Home / Self Care | Payer: Medicare Other | Source: Ambulatory Visit | Attending: Physician Assistant | Admitting: Physician Assistant

## 2022-11-18 ENCOUNTER — Other Ambulatory Visit: Payer: Self-pay | Admitting: Physician Assistant

## 2022-11-18 DIAGNOSIS — J22 Unspecified acute lower respiratory infection: Secondary | ICD-10-CM

## 2022-12-04 ENCOUNTER — Other Ambulatory Visit: Payer: 59

## 2023-01-07 ENCOUNTER — Other Ambulatory Visit: Payer: Self-pay | Admitting: Family Medicine

## 2023-01-07 DIAGNOSIS — Z1231 Encounter for screening mammogram for malignant neoplasm of breast: Secondary | ICD-10-CM

## 2023-01-23 DIAGNOSIS — J329 Chronic sinusitis, unspecified: Secondary | ICD-10-CM | POA: Diagnosis not present

## 2023-01-24 ENCOUNTER — Ambulatory Visit
Admission: RE | Admit: 2023-01-24 | Discharge: 2023-01-24 | Disposition: A | Discharge status: Home / Self Care | Payer: Medicare Other | Source: Ambulatory Visit | Attending: Family Medicine | Admitting: Family Medicine

## 2023-01-24 DIAGNOSIS — Z1231 Encounter for screening mammogram for malignant neoplasm of breast: Secondary | ICD-10-CM | POA: Diagnosis not present

## 2023-02-07 DIAGNOSIS — J029 Acute pharyngitis, unspecified: Secondary | ICD-10-CM | POA: Diagnosis not present

## 2023-02-07 DIAGNOSIS — J309 Allergic rhinitis, unspecified: Secondary | ICD-10-CM | POA: Diagnosis not present

## 2023-02-12 DIAGNOSIS — R87610 Atypical squamous cells of undetermined significance on cytologic smear of cervix (ASC-US): Secondary | ICD-10-CM | POA: Diagnosis not present

## 2023-02-12 DIAGNOSIS — R87620 Atypical squamous cells of undetermined significance on cytologic smear of vagina (ASC-US): Secondary | ICD-10-CM | POA: Diagnosis not present

## 2023-02-21 DIAGNOSIS — R87610 Atypical squamous cells of undetermined significance on cytologic smear of cervix (ASC-US): Secondary | ICD-10-CM | POA: Diagnosis not present

## 2023-02-21 DIAGNOSIS — R87620 Atypical squamous cells of undetermined significance on cytologic smear of vagina (ASC-US): Secondary | ICD-10-CM | POA: Diagnosis not present

## 2023-02-21 DIAGNOSIS — R87810 Cervical high risk human papillomavirus (HPV) DNA test positive: Secondary | ICD-10-CM | POA: Diagnosis not present

## 2023-02-28 DIAGNOSIS — R87610 Atypical squamous cells of undetermined significance on cytologic smear of cervix (ASC-US): Secondary | ICD-10-CM | POA: Diagnosis not present

## 2023-02-28 DIAGNOSIS — L292 Pruritus vulvae: Secondary | ICD-10-CM | POA: Diagnosis not present

## 2023-02-28 DIAGNOSIS — N898 Other specified noninflammatory disorders of vagina: Secondary | ICD-10-CM | POA: Diagnosis not present

## 2023-03-04 DIAGNOSIS — M7741 Metatarsalgia, right foot: Secondary | ICD-10-CM | POA: Diagnosis not present

## 2023-03-04 DIAGNOSIS — G8918 Other acute postprocedural pain: Secondary | ICD-10-CM | POA: Diagnosis not present

## 2023-03-04 DIAGNOSIS — M67874 Other specified disorders of tendon, left ankle and foot: Secondary | ICD-10-CM | POA: Diagnosis not present

## 2023-03-04 DIAGNOSIS — M2011 Hallux valgus (acquired), right foot: Secondary | ICD-10-CM | POA: Diagnosis not present

## 2023-03-04 DIAGNOSIS — M2041 Other hammer toe(s) (acquired), right foot: Secondary | ICD-10-CM | POA: Diagnosis not present

## 2023-03-04 DIAGNOSIS — M21611 Bunion of right foot: Secondary | ICD-10-CM | POA: Diagnosis not present

## 2023-03-17 DIAGNOSIS — L650 Telogen effluvium: Secondary | ICD-10-CM | POA: Diagnosis not present

## 2023-04-17 DIAGNOSIS — M7741 Metatarsalgia, right foot: Secondary | ICD-10-CM | POA: Diagnosis not present

## 2023-04-17 DIAGNOSIS — Z4889 Encounter for other specified surgical aftercare: Secondary | ICD-10-CM | POA: Diagnosis not present

## 2023-04-17 DIAGNOSIS — M2041 Other hammer toe(s) (acquired), right foot: Secondary | ICD-10-CM | POA: Diagnosis not present

## 2023-04-17 DIAGNOSIS — M21611 Bunion of right foot: Secondary | ICD-10-CM | POA: Diagnosis not present

## 2023-05-02 DIAGNOSIS — E039 Hypothyroidism, unspecified: Secondary | ICD-10-CM | POA: Diagnosis not present

## 2023-05-02 DIAGNOSIS — B002 Herpesviral gingivostomatitis and pharyngotonsillitis: Secondary | ICD-10-CM | POA: Diagnosis not present

## 2023-05-02 DIAGNOSIS — G43909 Migraine, unspecified, not intractable, without status migrainosus: Secondary | ICD-10-CM | POA: Diagnosis not present

## 2023-05-02 DIAGNOSIS — H9190 Unspecified hearing loss, unspecified ear: Secondary | ICD-10-CM | POA: Diagnosis not present

## 2023-05-02 DIAGNOSIS — E7800 Pure hypercholesterolemia, unspecified: Secondary | ICD-10-CM | POA: Diagnosis not present

## 2023-05-26 DIAGNOSIS — M21611 Bunion of right foot: Secondary | ICD-10-CM | POA: Diagnosis not present

## 2023-05-26 DIAGNOSIS — Z4889 Encounter for other specified surgical aftercare: Secondary | ICD-10-CM | POA: Diagnosis not present

## 2023-06-09 DIAGNOSIS — D225 Melanocytic nevi of trunk: Secondary | ICD-10-CM | POA: Diagnosis not present

## 2023-06-09 DIAGNOSIS — Z1283 Encounter for screening for malignant neoplasm of skin: Secondary | ICD-10-CM | POA: Diagnosis not present

## 2023-08-05 DIAGNOSIS — Z01419 Encounter for gynecological examination (general) (routine) without abnormal findings: Secondary | ICD-10-CM | POA: Diagnosis not present

## 2023-08-05 DIAGNOSIS — N5089 Other specified disorders of the male genital organs: Secondary | ICD-10-CM | POA: Diagnosis not present

## 2023-08-05 DIAGNOSIS — Z6825 Body mass index (BMI) 25.0-25.9, adult: Secondary | ICD-10-CM | POA: Diagnosis not present

## 2023-08-05 DIAGNOSIS — R87610 Atypical squamous cells of undetermined significance on cytologic smear of cervix (ASC-US): Secondary | ICD-10-CM | POA: Diagnosis not present

## 2023-09-10 DIAGNOSIS — R87610 Atypical squamous cells of undetermined significance on cytologic smear of cervix (ASC-US): Secondary | ICD-10-CM | POA: Diagnosis not present

## 2023-09-10 DIAGNOSIS — N870 Mild cervical dysplasia: Secondary | ICD-10-CM | POA: Diagnosis not present

## 2023-09-18 DIAGNOSIS — J0110 Acute frontal sinusitis, unspecified: Secondary | ICD-10-CM | POA: Diagnosis not present

## 2023-09-18 DIAGNOSIS — H9190 Unspecified hearing loss, unspecified ear: Secondary | ICD-10-CM | POA: Diagnosis not present

## 2023-10-01 DIAGNOSIS — K529 Noninfective gastroenteritis and colitis, unspecified: Secondary | ICD-10-CM | POA: Diagnosis not present

## 2023-11-15 IMAGING — MG MM DIGITAL SCREENING BILAT W/ TOMO AND CAD
8 series · 9 of 24 positions shown · non-contrast
Comparison: Previous exam(s).

CLINICAL DATA: Screening.

EXAM:
DIGITAL SCREENING BILATERAL MAMMOGRAM WITH TOMOSYNTHESIS AND CAD
TECHNIQUE: Bilateral screening digital craniocaudal and mediolateral oblique
mammograms were obtained. Bilateral screening digital breast
tomosynthesis was performed. The images were evaluated with
computer-aided detection.

[R MLO synth-2D]
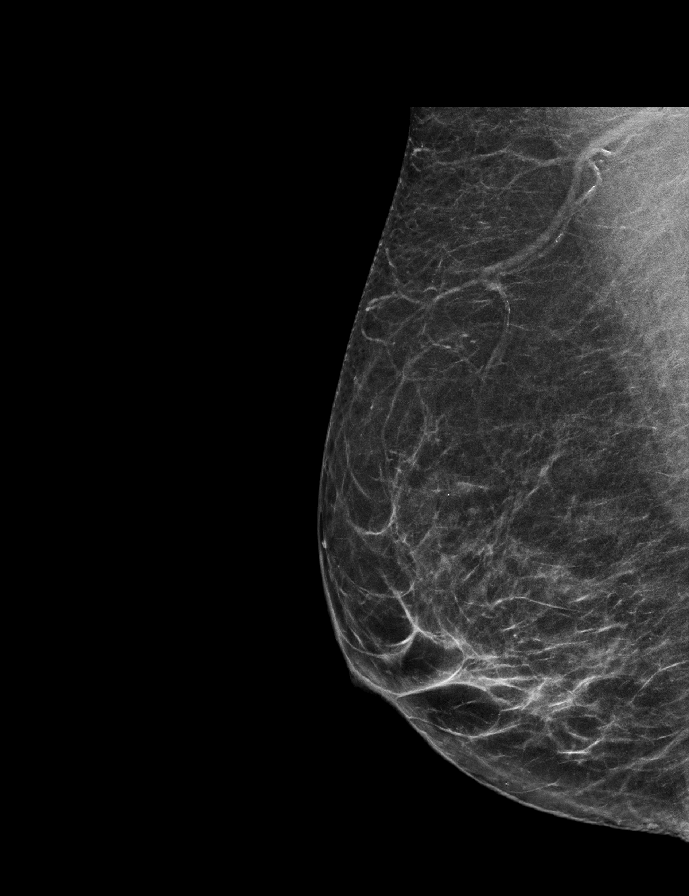

[L MLO synth-2D]
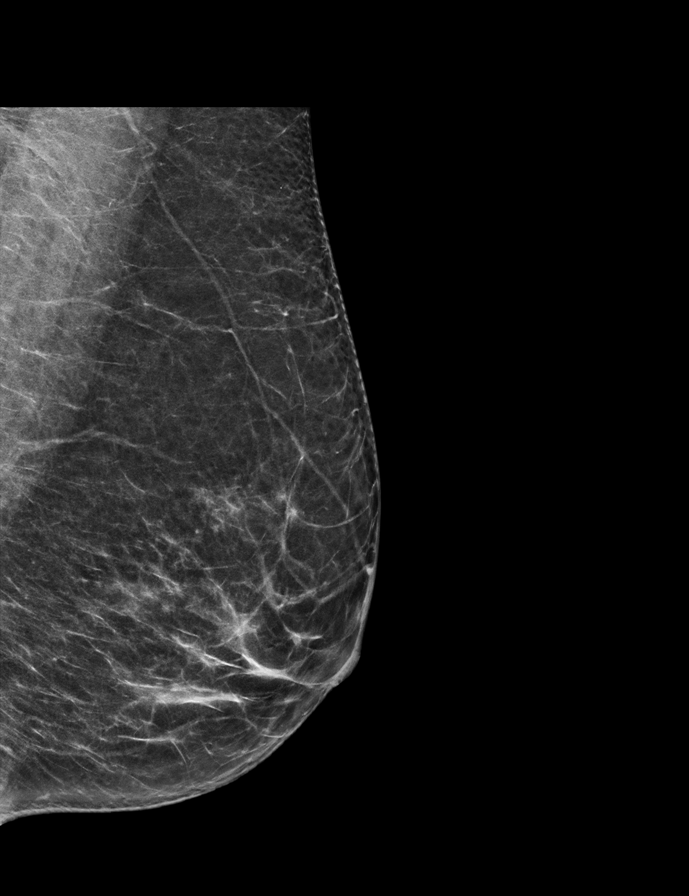

[L CC synth-2D]
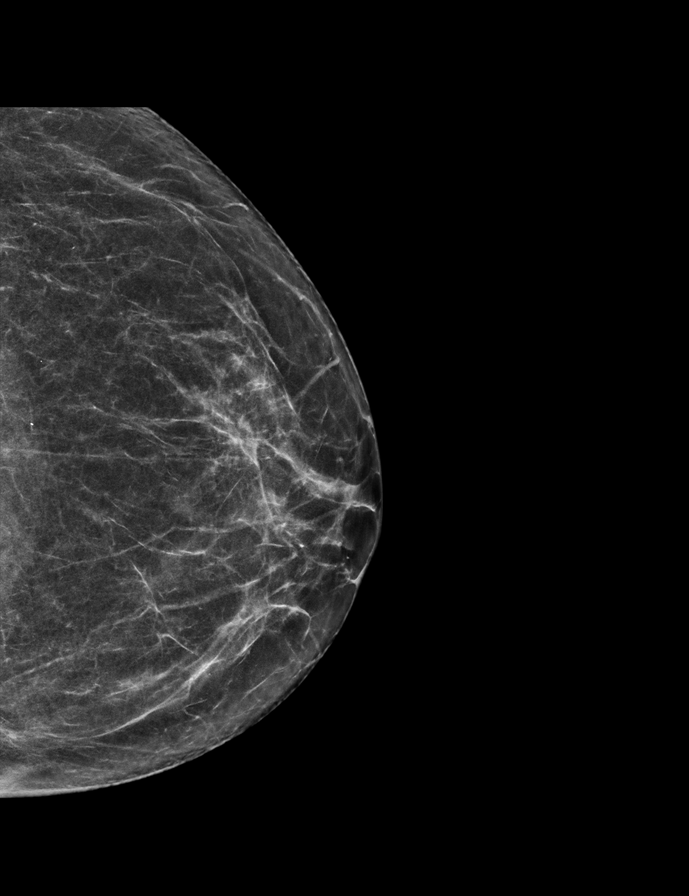

[R CC synth-2D]
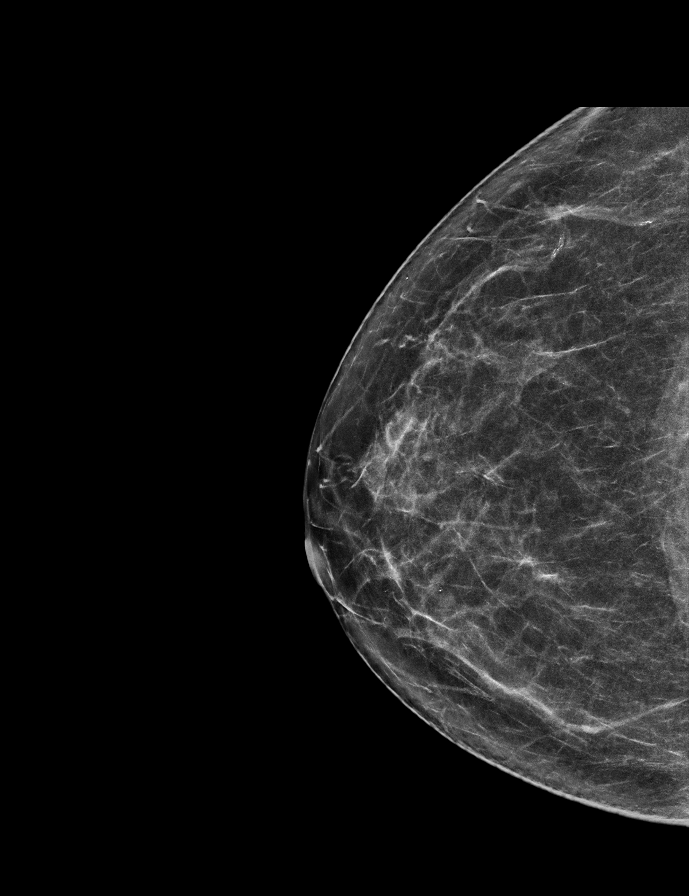

[L MLO tomo · 2 of 59 frames shown]
[frame 20/59]
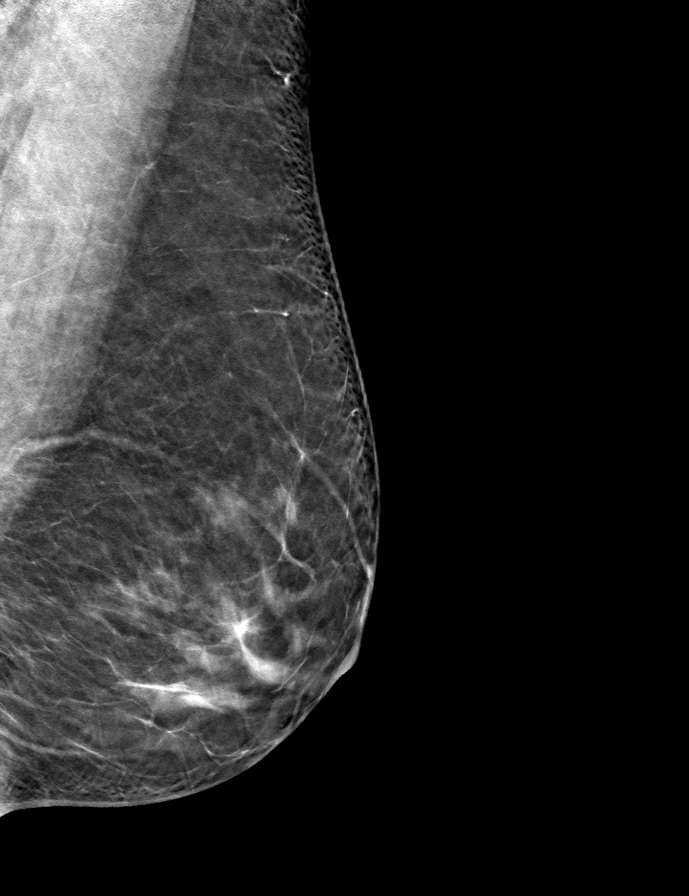
[frame 30/59]
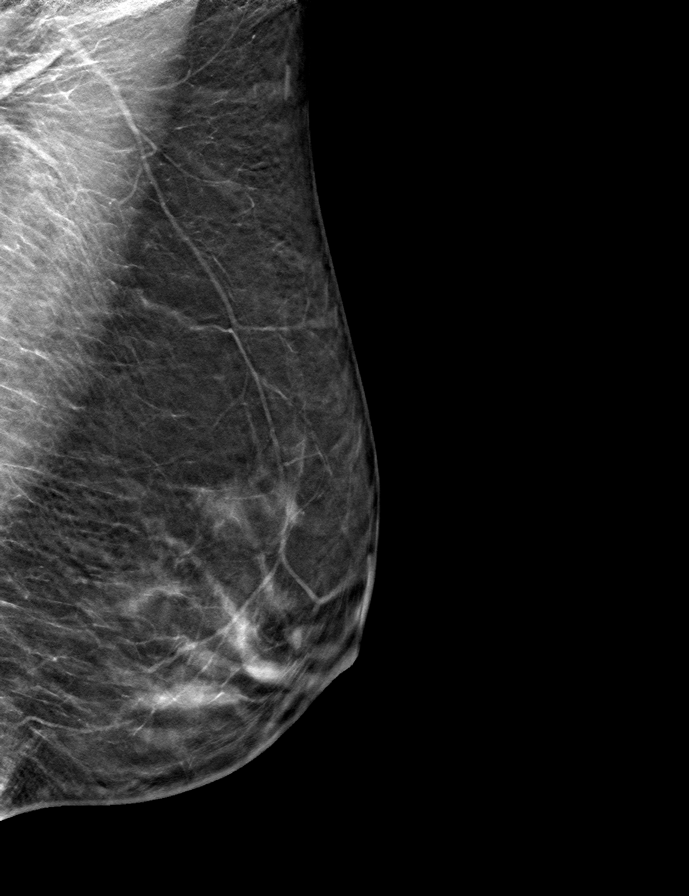

[R MLO tomo · tomo slice 29/57.0]
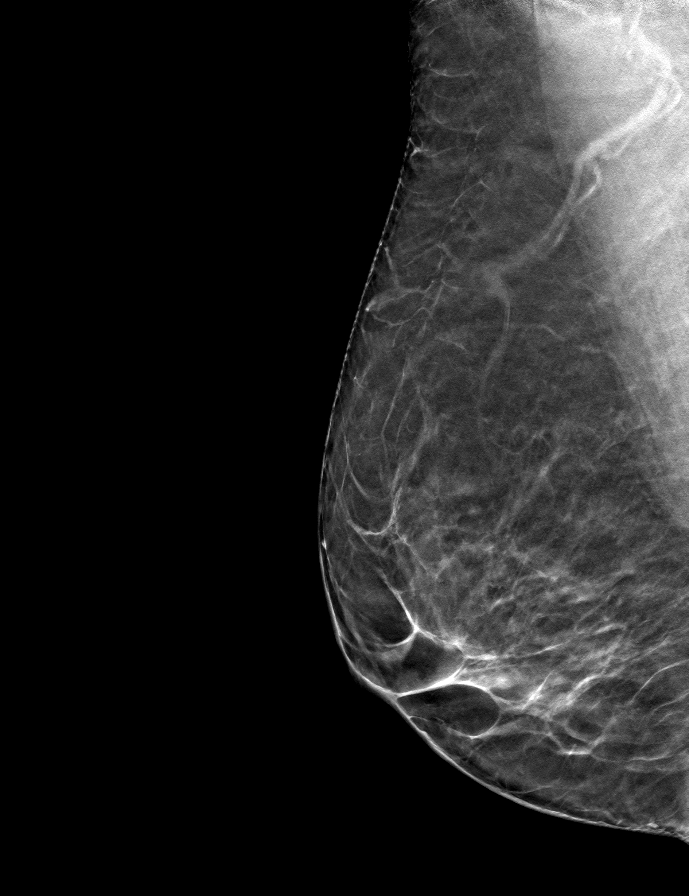

[R CC tomo · tomo slice 31/60.0]
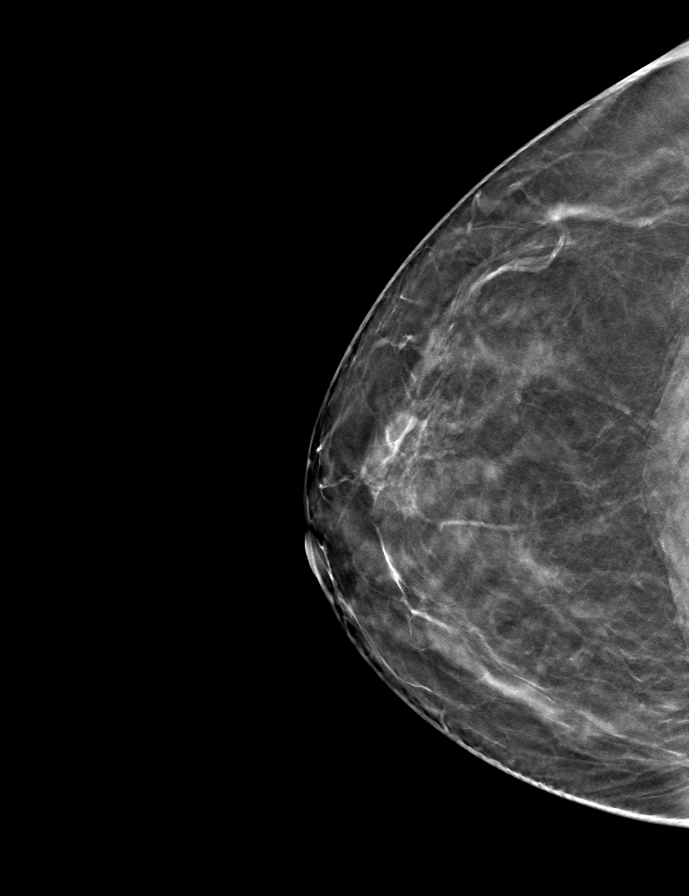

[L CC tomo · tomo slice 29/56.0]
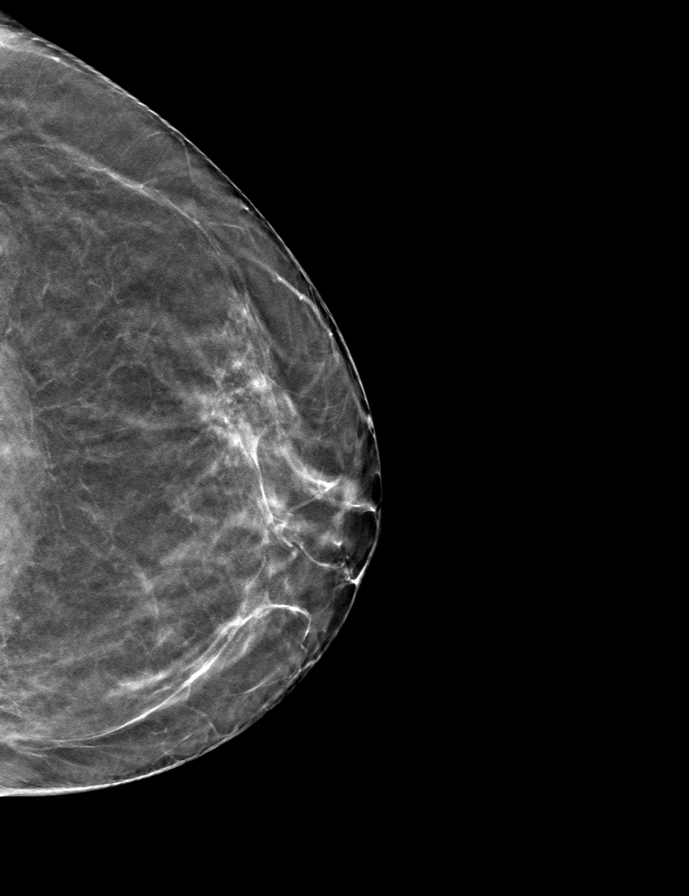

[9 of 24 positions shown; findings below may reference images not displayed]

ACR Breast Density Category b: There are scattered areas of
fibroglandular density.
FINDINGS: There are no findings suspicious for malignancy.
IMPRESSION: No mammographic evidence of malignancy. A result letter of this
screening mammogram will be mailed directly to the patient.

RECOMMENDATION:
Screening mammogram in one year. (Code:51-O-LD2)

BI-RADS CATEGORY  1: Negative.

## 2024-03-02 ENCOUNTER — Other Ambulatory Visit: Payer: Self-pay | Admitting: Family Medicine

## 2024-03-02 DIAGNOSIS — Z1231 Encounter for screening mammogram for malignant neoplasm of breast: Secondary | ICD-10-CM
